# Patient Record
Sex: Male | Born: 1990 | Race: White | Hispanic: No | Marital: Single | Smoking: Never smoker
Health system: Southern US, Community
[De-identification: ages and names within clinical notes are randomized; demographics above are authoritative.]

---

## 2020-06-08 ENCOUNTER — Ambulatory Visit (INDEPENDENT_AMBULATORY_CARE_PROVIDER_SITE_OTHER): Payer: 59 | Admitting: Behavioral Health

## 2020-06-08 ENCOUNTER — Other Ambulatory Visit: Payer: Self-pay

## 2020-06-08 ENCOUNTER — Ambulatory Visit (INDEPENDENT_AMBULATORY_CARE_PROVIDER_SITE_OTHER): Payer: 59 | Admitting: Physician Assistant

## 2020-06-08 VITALS — BP 142/91 | HR 89 | Resp 100 | Ht 69.0 in | Wt 171.0 lb

## 2020-06-08 DIAGNOSIS — F909 Attention-deficit hyperactivity disorder, unspecified type: Secondary | ICD-10-CM | POA: Diagnosis not present

## 2020-06-08 DIAGNOSIS — F32A Depression, unspecified: Secondary | ICD-10-CM

## 2020-06-08 DIAGNOSIS — G479 Sleep disorder, unspecified: Secondary | ICD-10-CM

## 2020-06-08 NOTE — Progress Notes (Deleted)
Most reently with individual therapy - Tracy Robinson  Med:  Sertriline - 10mg  (1 week) Lamotrigin - 150mg  (1 week) 30mg  - Adderall (2-3 weeks)  [facility])  [facility]   My dad died from alcoholism and sister was in and out of ciincas for eating disorders  29yo  Adderall prescription 2 years ago - I was having a lot fo trouble focusing and low energy depression related to lack of interest
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
## 2020-06-13 MED ORDER — SERTRALINE HCL 100 MG PO TABS: 100.0000 mg | ORAL_TABLET | Freq: Every day | ORAL | 1 refills | Status: DC

## 2020-06-13 MED ORDER — AMPHETAMINE-DEXTROAMPHET ER 30 MG PO CP24: 30.0000 mg | ORAL_CAPSULE | Freq: Every day | ORAL | 0 refills | Status: DC

## 2020-06-13 MED ORDER — LAMOTRIGINE 100 MG PO TABS: 100.0000 mg | ORAL_TABLET | Freq: Every day | ORAL | 1 refills | Status: DC

## 2020-06-13 MED ORDER — TRAZODONE HCL 50 MG PO TABS: 50.0000 mg | ORAL_TABLET | Freq: Every day | ORAL | 1 refills | Status: DC

## 2020-07-11 ENCOUNTER — Encounter (HOSPITAL_COMMUNITY): Payer: Self-pay | Admitting: Physician Assistant

## 2020-07-11 ENCOUNTER — Ambulatory Visit (INDEPENDENT_AMBULATORY_CARE_PROVIDER_SITE_OTHER): Payer: 59 | Admitting: Physician Assistant

## 2020-07-11 ENCOUNTER — Other Ambulatory Visit: Payer: Self-pay

## 2020-07-11 VITALS — BP 129/81 | HR 96 | Ht 69.0 in | Wt 181.0 lb

## 2020-07-11 DIAGNOSIS — G479 Sleep disorder, unspecified: Secondary | ICD-10-CM | POA: Diagnosis not present

## 2020-07-11 DIAGNOSIS — F909 Attention-deficit hyperactivity disorder, unspecified type: Secondary | ICD-10-CM

## 2020-07-11 DIAGNOSIS — F32A Depression, unspecified: Secondary | ICD-10-CM

## 2020-07-11 MED ORDER — SERTRALINE HCL 100 MG PO TABS: 100.0000 mg | ORAL_TABLET | Freq: Every day | ORAL | 1 refills | Status: DC

## 2020-07-11 MED ORDER — LAMOTRIGINE 100 MG PO TABS
100.0000 mg | ORAL_TABLET | Freq: Every day | ORAL | 1 refills | Status: DC
Start: 2020-07-11 — End: 2020-09-11

## 2020-07-11 MED ORDER — AMPHETAMINE-DEXTROAMPHET ER 30 MG PO CP24: 30.0000 mg | ORAL_CAPSULE | Freq: Every day | ORAL | 0 refills | Status: DC

## 2020-07-11 MED ORDER — TRAZODONE HCL 100 MG PO TABS: 100.0000 mg | ORAL_TABLET | Freq: Every day | ORAL | 1 refills | Status: DC

## 2020-07-11 NOTE — Progress Notes (Signed)
BH MD/PA/NP OP Progress Note  07/11/2020 1:38 PM Tracy Robinson  MRN:  [id number]  Chief Complaint:  Chief Complaint    Medication Management     HPI:  Tracy Robinson is a 29 year old male with a past psychiatric history significant for depression, anxiety, ADHD, and sleep disturbances who [facility].  Patient is currently being managed on the following medications:  Lamotrigine 100 mg daily Sertraline 100 mg daily Adderall extended release 30 mg 24-hour capsule Trazodone 50 mg at bedtime  Patient reports no issues with his current medication regimen.  Patient would like to remain on 100 mg of lamotrigine daily as he felt a change in his interest/motivation and was able to get through the day.  Patient states that his Adderall XR 30 mg made him feel less irritable and states that it is much easier to remain compliant on the medication.  Patient is unable to tell whether or not his sleep has improved on 50 mg of trazodone at bedtime.  Patient does endorse that he been sleeping better and has not been been struggling to fall asleep.  Patient endorses that he still wakes up from time to time.  Patient is requesting refills on all his medications.  Patient reports that his mood has been better and that he experiences fewer days of inactivity.  Patient endorses an increase in feeling more interested in the things that he cares about.  Patient denies suicidal and homicidal ideations.  He further denies auditory or visual hallucinations.  He endorses fair sleep and receives on average 6-1/2 to 7 hours of intermittent sleep.  Patient endorses good appetite and states that he experiences stress eating and snacking throughout the day.  Patient endorses alcohol consumption (beer/wine per day).  Patient denies tobacco use.  Patient endorses marijuana use sparingly and states that he has smokes twice in the last 2 months.  Visit  Diagnosis:    ICD-10-CM   1. Attention deficit hyperactivity disorder (ADHD), unspecified ADHD type  F90.9 amphetamine-dextroamphetamine (ADDERALL XR) 30 MG 24 hr capsule    amphetamine-dextroamphetamine (ADDERALL XR) 30 MG 24 hr capsule  2. Sleep disturbances  G47.9 traZODone (DESYREL) 100 MG tablet  3. Depression, unspecified depression type  F32.A sertraline (ZOLOFT) 100 MG tablet    traZODone (DESYREL) 100 MG tablet    lamoTRIgine (LAMICTAL) 100 MG tablet    Past Psychiatric History: Depression Generalized anxiety disorder ADHD Sleep disturbances  Past Medical History: No past medical history on file.   Family Psychiatric History:  Father (deceased) - Alcoholism Sister - mood disorder. Patient states that his sister has also struggled with anorexia and bulimia  Family History: No family history on file.  Social History:  Social History   Socioeconomic History  . Marital status: Single    Spouse name: Not on file  . Number of children: Not on file  . Years of education: Not on file  . Highest education level: Not on file  Occupational History  . Not on file  Tobacco Use  . Smoking status: Never Smoker  . Smokeless tobacco: Never Used  Substance and Sexual Activity  . Alcohol use: Not on file  . Drug use: Not on file  . Sexual activity: Not on file  Other Topics Concern  . Not on file  Social History Narrative  . Not on file   Social Determinants of Health   Financial Resource Strain: Not on file  Food Insecurity: Not on  file  Transportation Needs: Not on file  Physical Activity: Not on file  Stress: Not on file  Social Connections: Not on file    Allergies: No Known Allergies  Metabolic Disorder Labs: No results found for: HGBA1C, MPG No results found for: PROLACTIN No results found for: CHOL, TRIG, HDL, CHOLHDL, VLDL, LDLCALC No results found for: TSH  Therapeutic Level Labs: No results found for: LITHIUM No results found for: VALPROATE No  components found for:  CBMZ  Current Medications: Current Outpatient Medications  Medication Sig Dispense Refill  . [START ON 07/18/2020] amphetamine-dextroamphetamine (ADDERALL XR) 30 MG 24 hr capsule Take 1 capsule (30 mg total) by mouth daily. 30 capsule 0  . [START ON 08/17/2020] amphetamine-dextroamphetamine (ADDERALL XR) 30 MG 24 hr capsule Take 1 capsule (30 mg total) by mouth daily. 30 capsule 0  . lamoTRIgine (LAMICTAL) 100 MG tablet Take 1 tablet (100 mg total) by mouth daily. 30 tablet 1  . sertraline (ZOLOFT) 100 MG tablet Take 1 tablet (100 mg total) by mouth daily. 30 tablet 1  . traZODone (DESYREL) 100 MG tablet Take 1 tablet (100 mg total) by mouth at bedtime. 30 tablet 1   No current facility-administered medications for this visit.     Musculoskeletal: Strength & Muscle Tone: within normal limits Gait & Station: normal Patient leans: N/A  Psychiatric Specialty Exam: Review of Systems  Psychiatric/Behavioral: Positive for sleep disturbance. Negative for decreased concentration, dysphoric mood, hallucinations, self-injury and suicidal ideas. The patient is not nervous/anxious and is not hyperactive.     Blood pressure 129/81, pulse 96, height 5\' 9"  (1.753 m), weight 181 lb (82.1 kg), SpO2 99 %.Body mass index is 26.73 kg/m.  General Appearance: Well Groomed  Eye Contact:  Good  Speech:  Clear and Coherent and Normal Rate  Volume:  Normal  Mood:  Euthymic  Affect:  Appropriate  Thought Process:  Coherent, Goal Directed and Descriptions of Associations: Intact  Orientation:  Full (Time, Place, and Person)  Thought Content: WDL and Logical   Suicidal Thoughts:  No  Homicidal Thoughts:  No  Memory:  Immediate;   Good Recent;   Good Remote;   Good  Judgement:  Good  Insight:  Good  Psychomotor Activity:  Normal  Concentration:  Concentration: Good and Attention Span: Good  Recall:  Good  Fund of Knowledge: Good  Language: Good  Akathisia:  NA  Handed:  Right   AIMS (if indicated): not done  Assets:  Communication Skills Desire for Improvement Housing Social Support Vocational/Educational  ADL's:  Intact  Cognition: WNL  Sleep:  Fair   Screenings:   Assessment and Plan:  Tracy Robinson is a 29 year old male with a past psychiatric history significant for depression, anxiety, ADHD, and sleep disturbances who presents to [facility] for medication management. Patient reports no issues with his current medication regimen.  Patient would like to remain on lamotrigine 100 mg daily due to noticing an increase in interests/motivations.  Patient reports that he still experiences waking up occasionally from his sleep at times.  Patient was recommended increasing trazodone dose from 50 mg to 100 mg at bedtime.  Patient was agreeable to recommendation.  Patient is requesting refills on all his medications.  Patient's medications will be e-prescribed to pharmacy of choice.  1. Attention deficit hyperactivity disorder (ADHD), unspecified ADHD type  - amphetamine-dextroamphetamine (ADDERALL XR) 30 MG 24 hr capsule; Take 1 capsule (30 mg total) by mouth daily.  Dispense: 30 capsule; Refill:  0 - amphetamine-dextroamphetamine (ADDERALL XR) 30 MG 24 hr capsule; Take 1 capsule (30 mg total) by mouth daily.  Dispense: 30 capsule; Refill: 0  2. Sleep disturbances  - traZODone (DESYREL) 100 MG tablet; Take 1 tablet (100 mg total) by mouth at bedtime.  Dispense: 30 tablet; Refill: 1  3. Depression, unspecified depression type  - sertraline (ZOLOFT) 100 MG tablet; Take 1 tablet (100 mg total) by mouth daily.  Dispense: 30 tablet; Refill: 1 - traZODone (DESYREL) 100 MG tablet; Take 1 tablet (100 mg total) by mouth at bedtime.  Dispense: 30 tablet; Refill: 1 - lamoTRIgine (LAMICTAL) 100 MG tablet; Take 1 tablet (100 mg total) by mouth daily.  Dispense: 30 tablet; Refill: 1  Patient to follow up in 6 weeks  Meta Hatchet,  PA 07/11/2020, 1:38 PM

## 2020-08-10 ENCOUNTER — Telehealth (HOSPITAL_COMMUNITY): Payer: Self-pay | Admitting: *Deleted

## 2020-08-10 NOTE — Telephone Encounter (Signed)
[facility] SENT FAXED REFILL REQUEST ON MED'S  amphetamine-dextroamphetamine (ADDERALL XR) 30 MG 24 hr capsule  amphetamine-dextroamphetamine (ADDERALL XR) 30 MG 24 hr capsule  lamoTRIgine (LAMICTAL) 100 MG tablet  sertraline (ZOLOFT) 100 MG tablet  traZODone (DESYREL) 100 MG tablet

## 2020-08-10 NOTE — Telephone Encounter (Signed)
Writer contacted by Tracy Robinson, RMA regarding medication refill request. Patient's medications were called into respective pharmacy.

## 2020-08-17 ENCOUNTER — Encounter (HOSPITAL_COMMUNITY): Payer: Self-pay | Admitting: Physician Assistant

## 2020-08-17 ENCOUNTER — Telehealth: Payer: Self-pay | Admitting: Family Medicine

## 2020-08-17 DIAGNOSIS — F909 Attention-deficit hyperactivity disorder, unspecified type: Secondary | ICD-10-CM | POA: Insufficient documentation

## 2020-08-17 DIAGNOSIS — F32A Depression, unspecified: Secondary | ICD-10-CM | POA: Insufficient documentation

## 2020-08-17 DIAGNOSIS — G479 Sleep disorder, unspecified: Secondary | ICD-10-CM | POA: Insufficient documentation
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
## 2020-08-21 ENCOUNTER — Encounter (HOSPITAL_COMMUNITY): Payer: Self-pay | Admitting: Physician Assistant

## 2020-08-22 ENCOUNTER — Encounter (HOSPITAL_COMMUNITY): Payer: 59 | Admitting: Physician Assistant

## 2020-09-11 ENCOUNTER — Other Ambulatory Visit (HOSPITAL_COMMUNITY): Payer: Self-pay | Admitting: Physician Assistant

## 2020-09-11 ENCOUNTER — Telehealth (HOSPITAL_COMMUNITY): Payer: Self-pay | Admitting: *Deleted

## 2020-09-11 DIAGNOSIS — F32A Depression, unspecified: Secondary | ICD-10-CM

## 2020-09-11 MED ORDER — LAMOTRIGINE 100 MG PO TABS: 100.0000 mg | ORAL_TABLET | Freq: Every day | ORAL | 1 refills | Status: DC

## 2020-09-11 NOTE — Telephone Encounter (Signed)
Provider was contacted by  Direce E McIntyre, RMA regarding refill request for patient. Patient's medication will be e-prescribed to pharmacy of choice. 

## 2020-09-11 NOTE — Progress Notes (Signed)
Provider was contacted by  Rushie Chestnut, RMA regarding refill request for patient. Patient's medication will be e-prescribed to pharmacy of choice.

## 2020-09-11 NOTE — Telephone Encounter (Signed)
Refill Request lamoTRIgine (LAMICTAL) 100 MG tablet 30 tablet 1 07/11/2020    Sig - Route: Take 1 tablet (100 mg total) by mouth daily

## 2020-09-22 ENCOUNTER — Encounter (HOSPITAL_COMMUNITY): Payer: Self-pay | Admitting: Physician Assistant

## 2020-09-22 ENCOUNTER — Other Ambulatory Visit: Payer: Self-pay

## 2020-09-22 ENCOUNTER — Ambulatory Visit (INDEPENDENT_AMBULATORY_CARE_PROVIDER_SITE_OTHER): Payer: 59 | Admitting: Physician Assistant

## 2020-09-22 VITALS — BP 124/74 | HR 77 | Ht 69.0 in | Wt 171.0 lb

## 2020-09-22 DIAGNOSIS — F909 Attention-deficit hyperactivity disorder, unspecified type: Secondary | ICD-10-CM | POA: Diagnosis not present

## 2020-09-22 DIAGNOSIS — G479 Sleep disorder, unspecified: Secondary | ICD-10-CM | POA: Diagnosis not present

## 2020-09-22 DIAGNOSIS — F32A Depression, unspecified: Secondary | ICD-10-CM

## 2020-09-22 MED ORDER — SERTRALINE HCL 100 MG PO TABS: 100.0000 mg | ORAL_TABLET | Freq: Every day | ORAL | 1 refills | Status: DC

## 2020-09-22 MED ORDER — AMPHETAMINE-DEXTROAMPHET ER 30 MG PO CP24: 30.0000 mg | ORAL_CAPSULE | Freq: Every day | ORAL | 0 refills | Status: DC

## 2020-09-22 MED ORDER — TRAZODONE HCL 100 MG PO TABS: 100.0000 mg | ORAL_TABLET | Freq: Every day | ORAL | 1 refills | Status: DC

## 2020-09-22 NOTE — Progress Notes (Signed)
BH MD/PA/NP OP Progress Note  09/22/2020 2:42 PM Tracy Robinson  MRN:  [id number]  Chief Complaint:  Chief Complaint    Medication Management     HPI:   Tracy Robinson is a 30 year old male with a past psychiatric history significant for major depressive disorder, generalized anxiety disorder, attention deficit hyperactivity disorder, and sleep disturbances who presents to [facility] for follow-up and medication management.  Patient is currently being managed on the following medications:  Sertraline 100 mg daily Adderall extended release 30 mg daily Trazodone 100 mg daily Lamictal 100 mg daily  Patient reports no issues or concerns with his current medication regimen.  Patient does endorse an issue he has been having since the previous encounter.  Patient states that his partner brought to his attention that he has been less connected and shown little interest in things.  Patient describes the behavior as not being present in the moment.  Patient reports that he feels this way roughly once a week and it lasts the greater portion of the day.  Patient attributes recent behavior to finishing a year-long/stressful project assignment.  Patient feels that his medications do not need to be readjusted for this behavior.  Patient would like to go down on his lamotrigine dosage because he does not feel that it is an essential contributor to his overall improvement in mood.  Patient is pleasant, calm, cooperative, and fully engaged in conversation during the encounter.  Patient reports improved mood and rates his mood a 6 out of 10.  Patient denies suicidal or homicidal ideations.  He further denies auditory or visual hallucinations.  Patient endorses good sleep and receives around 8 hours of sleep each night.  Patient endorses good appetite and eats on average 3 meals per day.  Patient endorses alcohol consumption and has roughly 5 drinks per week.  Patient denies tobacco  use.  Patient endorses illicit drug use in the form of marijuana at least once a week.  A GAD-7 screen was performed with the patient scoring a 20.  A PHQ 9 screen was also performed with the patient scoring a 23.  Visit Diagnosis:    ICD-10-CM   1. Depression, unspecified depression type  F32.A sertraline (ZOLOFT) 100 MG tablet    traZODone (DESYREL) 100 MG tablet  2. Attention deficit hyperactivity disorder (ADHD), unspecified ADHD type  F90.9 amphetamine-dextroamphetamine (ADDERALL XR) 30 MG 24 hr capsule  3. Sleep disturbances  G47.9 traZODone (DESYREL) 100 MG tablet    Past Psychiatric History: Depression Generalized anxiety disorder ADHD Sleep disturbances  Past Medical History: No past medical history on file. No past surgical history on file.  Family Psychiatric History:  Father (deceased) - Alcoholism Sister - mood disorder. Patient states that his sister has also struggled with anorexia and bulimia  Family History: No family history on file.  Social History:  Social History   Socioeconomic History  . Marital status: Single    Spouse name: Not on file  . Number of children: Not on file  . Years of education: Not on file  . Highest education level: Not on file  Occupational History  . Not on file  Tobacco Use  . Smoking status: Never Smoker  . Smokeless tobacco: Never Used  Substance and Sexual Activity  . Alcohol use: Not on file  . Drug use: Not on file  . Sexual activity: Not on file  Other Topics Concern  . Not on file  Social History Narrative  .  Not on file   Social Determinants of Health   Financial Resource Strain: Not on file  Food Insecurity: Not on file  Transportation Needs: Not on file  Physical Activity: Not on file  Stress: Not on file  Social Connections: Not on file    Allergies: No Known Allergies  Metabolic Disorder Labs: No results found for: HGBA1C, MPG No results found for: PROLACTIN No results found for: CHOL, TRIG, HDL,  CHOLHDL, VLDL, LDLCALC No results found for: TSH  Therapeutic Level Labs: No results found for: LITHIUM No results found for: VALPROATE No components found for:  CBMZ  Current Medications: Current Outpatient Medications  Medication Sig Dispense Refill  . lamoTRIgine (LAMICTAL) 100 MG tablet Take 1 tablet (100 mg total) by mouth daily. 30 tablet 1  . amphetamine-dextroamphetamine (ADDERALL XR) 30 MG 24 hr capsule Take 1 capsule (30 mg total) by mouth daily. 30 capsule 0  . sertraline (ZOLOFT) 100 MG tablet Take 1 tablet (100 mg total) by mouth daily. 30 tablet 1  . traZODone (DESYREL) 100 MG tablet Take 1 tablet (100 mg total) by mouth at bedtime. 30 tablet 1   No current facility-administered medications for this visit.     Musculoskeletal: Strength & Muscle Tone: within normal limits Gait & Station: normal Patient leans: N/A  Psychiatric Specialty Exam: Review of Systems  Psychiatric/Behavioral: Negative for decreased concentration, dysphoric mood, hallucinations, self-injury, sleep disturbance and suicidal ideas. The patient is nervous/anxious. The patient is not hyperactive.     Blood pressure 124/74, pulse 77, height 5\' 9"  (1.753 m), weight 171 lb (77.6 kg), SpO2 100 %.Body mass index is 25.25 kg/m.  General Appearance: Well Groomed  Eye Contact:  Good  Speech:  Clear and Coherent and Normal Rate  Volume:  Normal  Mood:  Anxious and Euthymic  Affect:  Appropriate and Congruent  Thought Process:  Coherent, Goal Directed and Descriptions of Associations: Intact  Orientation:  Full (Time, Place, and Person)  Thought Content: WDL and Logical   Suicidal Thoughts:  No  Homicidal Thoughts:  No  Memory:  Immediate;   Good Recent;   Good Remote;   Good  Judgement:  Good  Insight:  Good  Psychomotor Activity:  Normal  Concentration:  Concentration: Good and Attention Span: Good  Recall:  Good  Fund of Knowledge: Good  Language: Good  Akathisia:  NA  Handed:  Right   AIMS (if indicated): not done  Assets:  Communication Skills Desire for Improvement Housing Social Support Vocational/Educational  ADL's:  Intact  Cognition: WNL  Sleep:  Good   Screenings: GAD-7   Flowsheet Row Office Visit from 09/22/2020 in [facility]  Total GAD-7 Score 20    PHQ2-9   Flowsheet Row Office Visit from 09/22/2020 in [facility]  PHQ-2 Total Score 5  PHQ-9 Total Score 18    Flowsheet Row Office Visit from 09/22/2020 in [facility]  C-SSRS RISK CATEGORY No Risk       Assessment and Plan:   Jaimon Bugaj is a 31 year old male with a past psychiatric history significant for major depressive disorder, generalized anxiety disorder, attention deficit hyperactivity disorder, and sleep disturbances who presents to [facility] for follow-up and medication management.  Patient denies issues or concerns with his current medication regimen.  Patient would like to go down on his dosage of Lamictal because he feels that it is not contributing as much to the improvement of  his mood.  Patient was recommended to decrease dosage of Lamictal from 100 mg to 50 mg.  Patient was agreeable to recommendation.  Patient is requesting refills on his medications following the conclusion of the encounter.  Patient's medications will be e-prescribed to pharmacy of choice.  1. Depression, unspecified depression type Patient to start taking lamotrigine 50 mg daily.  - sertraline (ZOLOFT) 100 MG tablet; Take 1 tablet (100 mg total) by mouth daily.  Dispense: 30 tablet; Refill: 1 - traZODone (DESYREL) 100 MG tablet; Take 1 tablet (100 mg total) by mouth at bedtime.  Dispense: 30 tablet; Refill: 1  2. Attention deficit hyperactivity disorder (ADHD), unspecified ADHD type  - amphetamine-dextroamphetamine (ADDERALL XR) 30 MG 24 hr capsule; Take 1 capsule (30 mg total) by mouth  daily.  Dispense: 30 capsule; Refill: 0  3. Sleep disturbances  - traZODone (DESYREL) 100 MG tablet; Take 1 tablet (100 mg total) by mouth at bedtime.  Dispense: 30 tablet; Refill: 1  Patient to follow-up in 2 months  Meta Hatchet, PA 09/22/2020, 2:42 PM

## 2020-10-23 ENCOUNTER — Other Ambulatory Visit (HOSPITAL_COMMUNITY): Payer: Self-pay | Admitting: Physician Assistant

## 2020-10-23 ENCOUNTER — Telehealth (HOSPITAL_COMMUNITY): Payer: Self-pay | Admitting: *Deleted

## 2020-10-23 DIAGNOSIS — F909 Attention-deficit hyperactivity disorder, unspecified type: Secondary | ICD-10-CM

## 2020-10-23 MED ORDER — AMPHETAMINE-DEXTROAMPHET ER 30 MG PO CP24: 30.0000 mg | ORAL_CAPSULE | Freq: Every day | ORAL | 0 refills | Status: DC

## 2020-10-23 NOTE — Telephone Encounter (Signed)
VM left for writer stating he needed a new rx for his Adderall called into his pharmacy. Reviewed record and he should in fact be out. He also wanted to schedule an appt for 1;1 therapy.  Will reach out to his proivder for a new rx and have the front desk to call him back with a therapy appt.

## 2020-10-23 NOTE — Progress Notes (Signed)
Provider was contacted by Tracy Luna, RN regarding patient's medication refill. Patient's medication will be e-prescribed to pharmacy of choice.

## 2020-10-23 NOTE — Telephone Encounter (Signed)
Patient's medication e-prescribed to pharmacy of choice. 

## 2020-11-22 ENCOUNTER — Other Ambulatory Visit: Payer: Self-pay

## 2020-11-22 ENCOUNTER — Encounter (HOSPITAL_COMMUNITY): Payer: Self-pay | Admitting: Physician Assistant

## 2020-11-22 ENCOUNTER — Ambulatory Visit (INDEPENDENT_AMBULATORY_CARE_PROVIDER_SITE_OTHER): Payer: 59 | Admitting: Physician Assistant

## 2020-11-22 ENCOUNTER — Ambulatory Visit (INDEPENDENT_AMBULATORY_CARE_PROVIDER_SITE_OTHER): Payer: 59 | Admitting: Psychiatry

## 2020-11-22 VITALS — BP 119/75 | HR 79 | Ht 69.0 in | Wt 159.0 lb

## 2020-11-22 DIAGNOSIS — F331 Major depressive disorder, recurrent, moderate: Secondary | ICD-10-CM

## 2020-11-22 DIAGNOSIS — F411 Generalized anxiety disorder: Secondary | ICD-10-CM | POA: Insufficient documentation

## 2020-11-22 DIAGNOSIS — F32A Depression, unspecified: Secondary | ICD-10-CM

## 2020-11-22 DIAGNOSIS — Z87898 Personal history of other specified conditions: Secondary | ICD-10-CM

## 2020-11-22 DIAGNOSIS — F1291 Cannabis use, unspecified, in remission: Secondary | ICD-10-CM

## 2020-11-22 DIAGNOSIS — G479 Sleep disorder, unspecified: Secondary | ICD-10-CM | POA: Diagnosis not present

## 2020-11-22 DIAGNOSIS — F909 Attention-deficit hyperactivity disorder, unspecified type: Secondary | ICD-10-CM

## 2020-11-22 MED ORDER — AMPHETAMINE-DEXTROAMPHET ER 30 MG PO CP24: 30.0000 mg | ORAL_CAPSULE | Freq: Every day | ORAL | 0 refills | Status: DC

## 2020-11-22 MED ORDER — SERTRALINE HCL 50 MG PO TABS
150.0000 mg | ORAL_TABLET | Freq: Every day | ORAL | 2 refills | Status: DC
Start: 2020-11-22 — End: 2021-01-23

## 2020-11-22 MED ORDER — TRAZODONE HCL 100 MG PO TABS: 100.0000 mg | ORAL_TABLET | Freq: Every day | ORAL | 1 refills | Status: DC
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
## 2020-11-22 NOTE — Progress Notes (Signed)
BH MD/PA/NP OP Progress Note  11/24/2020 9:29 PM Tracy Robinson  MRN:  [id number]  Chief Complaint:  Chief Complaint    Medication Management     HPI:   Tracy Robinson is a 30 year old male with a past psychiatric history significant for major depressive disorder, generalized anxiety disorder, attention deficit hyperactivity disorder, and sleep disturbances who presents to [facility] for follow-up and medication management.  Patient is currently being managed on the following medications:  Sertraline 100 mg daily Adderall XR 30 mg daily Trazodone 100 mg at bedtime Lamictal 50 mg daily  During the encounter, patient informed the provider that he had run out of Lamictal 50 mg for 2 weeks and decided to stop taking it altogether.  Patient states that he experienced some mood dysregulation after discontinuing lamictal but after 4 to 5 days, patient experienced no other side effects.  Patient reports that his current medication regimen has been going pretty well.  He expresses that he has not felt as bad and that his eating and sleeping has improved.  Patient reports some chest anxiety here and there and occasionally experiences nausea which he attributes from taking sertraline.    Patient expresses the complaint that his Adderall is not lasting long enough and feels that it tapers off around 7:30 PM.  Patient is interested in taking a different formulation of Adderall that may last longer than the current prescription he is taking.  Patient was informed that there was no other formulation the last longer than the current prescription of Adderall he is taking. Patient is agreeable to continuing his current prescription of Adderall XR 30 mg daily. Patient reports that he will be seeing a new therapist today and inquired about ways to effectively communicate with his therapist regarding his past history.  A PHQ-9 was performed with the patient scoring a 19.  A GAD-7  screen was also performed with the patient scoring a 19.  Patient is pleasant, calm, cooperative, and fully engaged in conversation during the encounter.  Patient reports that his current mood is frantic but attributes this feeling due to being late for his appointment today.  Patient denies any current negative feelings but states that he is behind on some of his work which often makes him feel inadequate and triggers episodes of self loathing.  Patient denies suicidal or homicidal ideations.  He further denies auditory or visual hallucinations.  Patient endorses good sleep and receives on average 8 hours of sleep each night.  Patient endorses good appetite and eats on average 2 meals a day with snacking in between.  Patient endorses alcohol consumption and states that he had 4 drinks this past week.  Patient denies tobacco use.  Patient endorses illicit drug use in the form of marijuana and states that he smokes 1-2 times per day.  Visit Diagnosis:    ICD-10-CM   1. Attention deficit hyperactivity disorder (ADHD), unspecified ADHD type  F90.9 amphetamine-dextroamphetamine (ADDERALL XR) 30 MG 24 hr capsule  2. Depression, unspecified depression type  F32.A traZODone (DESYREL) 100 MG tablet    sertraline (ZOLOFT) 50 MG tablet  3. Sleep disturbances  G47.9 traZODone (DESYREL) 100 MG tablet  4. Generalized anxiety disorder  F41.1 sertraline (ZOLOFT) 50 MG tablet    Past Psychiatric History:  Depression Generalized anxiety disorder ADHD Sleep disturbances  Past Medical History: History reviewed. No pertinent past medical history. History reviewed. No pertinent surgical history.  Family Psychiatric History:  Father (deceased) - Alcoholism Sister -  mood disorder. Patient states that his sister has also struggled with anorexia and bulimia  Family History: History reviewed. No pertinent family history.  Social History:  Social History   Socioeconomic History  . Marital status: Single     Spouse name: Not on file  . Number of children: Not on file  . Years of education: Not on file  . Highest education level: Not on file  Occupational History  . Not on file  Tobacco Use  . Smoking status: Never Smoker  . Smokeless tobacco: Never Used  Substance and Sexual Activity  . Alcohol use: Not on file  . Drug use: Not on file  . Sexual activity: Not on file  Other Topics Concern  . Not on file  Social History Narrative  . Not on file   Social Determinants of Health   Financial Resource Strain: Not on file  Food Insecurity: Not on file  Transportation Needs: Not on file  Physical Activity: Not on file  Stress: Not on file  Social Connections: Not on file    Allergies: No Known Allergies  Metabolic Disorder Labs: No results found for: HGBA1C, MPG No results found for: PROLACTIN No results found for: CHOL, TRIG, HDL, CHOLHDL, VLDL, LDLCALC No results found for: TSH  Therapeutic Level Labs: No results found for: LITHIUM No results found for: VALPROATE No components found for:  CBMZ  Current Medications: Current Outpatient Medications  Medication Sig Dispense Refill  . sertraline (ZOLOFT) 50 MG tablet Take 3 tablets (150 mg total) by mouth daily. 90 tablet 2  . amphetamine-dextroamphetamine (ADDERALL XR) 30 MG 24 hr capsule Take 1 capsule (30 mg total) by mouth daily. 30 capsule 0  . traZODone (DESYREL) 100 MG tablet Take 1 tablet (100 mg total) by mouth at bedtime. 30 tablet 1   No current facility-administered medications for this visit.     Musculoskeletal: Strength & Muscle Tone: within normal limits Gait & Station: normal Patient leans: N/A  Psychiatric Specialty Exam: Review of Systems  Psychiatric/Behavioral: Positive for decreased concentration. Negative for dysphoric mood, hallucinations, self-injury, sleep disturbance and suicidal ideas. The patient is nervous/anxious. The patient is not hyperactive.     Blood pressure 119/75, pulse 79, height  5\' 9"  (1.753 m), weight 159 lb (72.1 kg).Body mass index is 23.48 kg/m.  General Appearance: Well Groomed  Eye Contact:  Good  Speech:  Clear and Coherent and Normal Rate  Volume:  Normal  Mood:  Anxious and Depressed  Affect:  Congruent and Depressed  Thought Process:  Coherent, Goal Directed and Descriptions of Associations: Intact  Orientation:  Full (Time, Place, and Person)  Thought Content: WDL and Logical   Suicidal Thoughts:  No  Homicidal Thoughts:  No  Memory:  Immediate;   Good Recent;   Good Remote;   Good  Judgement:  Good  Insight:  Good  Psychomotor Activity:  Normal  Concentration:  Concentration: Good and Attention Span: Good  Recall:  Good  Fund of Knowledge: Good  Language: Good  Akathisia:  NA  Handed:  Right  AIMS (if indicated): not done  Assets:  Communication Skills Desire for Improvement Housing Social Support Vocational/Educational  ADL's:  Intact  Cognition: WNL  Sleep:  Good   Screenings: GAD-7   Flowsheet Row Office Visit from 11/22/2020 in [facility] Office Visit from 09/22/2020 in [facility]  Total GAD-7 Score 19 20    PHQ2-9   Flowsheet Row Office Visit from 11/22/2020  in [facility] Office Visit from 09/22/2020 in [facility]  PHQ-2 Total Score 5 5  PHQ-9 Total Score 19 18    Flowsheet Row Office Visit from 11/22/2020 in [facility] Office Visit from 09/22/2020 in [facility]  C-SSRS RISK CATEGORY No Risk No Risk       Assessment and Plan:   Illya Gienger is a 30 year old male with a past psychiatric history significant for major depressive disorder, generalized anxiety disorder, attention deficit hyperactivity disorder, and sleep disturbances who presents to [facility] clinic for follow-up and medication management.  Patient reports that  he discontinued taking Lamictal 50 mg after running out of his medication.  Patient denies any lasting side effects or mood dysregulation after discontinuing his Lamictal.  Patient denies the need for dosage adjustments with his current regimen of medications.  Patient is requesting refills following the conclusion of the encounter.  Patient's medications to be e-prescribed to pharmacy of choice.  1. Attention deficit hyperactivity disorder (ADHD), unspecified ADHD type  - amphetamine-dextroamphetamine (ADDERALL XR) 30 MG 24 hr capsule; Take 1 capsule (30 mg total) by mouth daily.  Dispense: 30 capsule; Refill: 0  2. Sleep disturbances  - traZODone (DESYREL) 100 MG tablet; Take 1 tablet (100 mg total) by mouth at bedtime.  Dispense: 30 tablet; Refill: 1  3. Generalized anxiety disorder  - sertraline (ZOLOFT) 50 MG tablet; Take 3 tablets (150 mg total) by mouth daily.  Dispense: 90 tablet; Refill: 2  4. Moderate episode of recurrent major depressive disorder (HCC)  - sertraline (ZOLOFT) 50 MG tablet; Take 3 tablets (150 mg total) by mouth daily.  Dispense: 90 tablet; Refill: 2  Patient to follow up in 2 months  Meta Hatchet, PA 11/24/2020, 9:29 PM

## 2020-11-24 DIAGNOSIS — F331 Major depressive disorder, recurrent, moderate: Secondary | ICD-10-CM | POA: Insufficient documentation

## 2020-12-12 ENCOUNTER — Other Ambulatory Visit: Payer: Self-pay

## 2020-12-12 ENCOUNTER — Ambulatory Visit (INDEPENDENT_AMBULATORY_CARE_PROVIDER_SITE_OTHER): Payer: 59 | Admitting: Psychiatry

## 2020-12-12 DIAGNOSIS — F331 Major depressive disorder, recurrent, moderate: Secondary | ICD-10-CM | POA: Diagnosis not present

## 2020-12-12 DIAGNOSIS — F411 Generalized anxiety disorder: Secondary | ICD-10-CM | POA: Diagnosis not present

## 2020-12-12 DIAGNOSIS — F909 Attention-deficit hyperactivity disorder, unspecified type: Secondary | ICD-10-CM | POA: Diagnosis not present

## 2020-12-12 DIAGNOSIS — Z638 Other specified problems related to primary support group: Secondary | ICD-10-CM

## 2020-12-12 DIAGNOSIS — Z630 Problems in relationship with spouse or partner: Secondary | ICD-10-CM | POA: Diagnosis not present

## 2020-12-12 NOTE — Progress Notes (Signed)
Psychotherapy Progress Note [facility] Marliss Czar, PhD LP  Patient ID: Tracy Robinson     MRN: [id number] Therapy format: Individual psychotherapy Date: 12/12/2020      Start: 9:05a     Stop: 9:55a     Time Spent: 50 min Location: In-person   Session narrative (presenting needs, interim history, self-report of stressors and symptoms, applications of prior therapy, status changes, and interventions made in session) "I sort of failed at writing things down", thought about it but didn't write.  Did the "I don't want to tantrum" as noted, learned the chapter really was not necessary, gave self permission not to work it.  Then got interested, researched a bit.  Lost some momentum, then went on family beach trip with mother (turned 70, not vaccinated).  Went [name] and sister for latter half, noted mother bad with boundaries with her sadsacking, alcoholic boyfriend who made inappropriate comments to Tracy Robinson (not revealed to Tracy Robinson) and got into fights [name] while there, pushing Tracy Robinson to leave early.  BF got manipulative, feigned suicidality, angered Tracy Robinson, but also ashamed of how he was feeling, having grown up in [place], then run away from family after graduation to [place], [place], wound up in Tracy [place], then pandemic made it possible   Coached in writing to advisor to come clean about being stuck/behind, working with therapist now, apology for leaving him in the dark.    Confesses he signed up for a bronze plan (ACA) and there is more trouble in his relationship [name] than revealed.  Deal made several year ago to scrupulously split costs 50-50, then she was depressed 6 years not working, apparently insisted on continuing the accounting, which meant she accrued $30K debt to him, which he is uncomfortable with.  Discussed his current need for financial backing in light of this, comparing the idea of no debt to each other -- as befits being married-in-principle -- with the idea  of being independent actors and one way to relieve her debt would be to help him financially right now, but he asserts it's more complicated than that since she feels he has been less than caring toward her during her depression and would have been more generous of time and attention.  Acknowledged there seems to be a much more nuanced relationship, can address it more at another time if desired, and the other options of temporary debt, taking on part-time job, or seeking help with others besides Tracy Robinson remain legitimate choices.    Meanwhile, confirmed action plans for today, believes he can accomplish both informing his advisor and investigating options with insurance.  Affirmed that writing down accomplishments like these is optional, too, not a requirement, just an encouragement available to him.  Briefly addressed the temptation to take on "50" other things he needs to do when he does focus on a task, modelling talking to internal distractions as if they are a crowd of people in his personal waiting room, making the point that all of them are being served in their turn, and it's important for "them" to be patient [name] so that he can move along and get to them in their due time, but rest assured, he is serving them, too.  Therapeutic modalities: Cognitive Behavioral Therapy and Solution-Oriented/Positive Psychology  Mental Status/Observations:  Appearance:   Casual     Behavior:  Appropriate  Motor:  Normal  Speech/Language:   Clear and Coherent  Affect:  Appropriate  Mood:  anxious and dysthymic  Thought process:  normal  Thought content:    WNL and self-recrimination  Sensory/Perceptual disturbances:    WNL  Orientation:  Fully oriented  Attention:  Good    Concentration:  Good  Memory:  WNL  Insight:    Good  Judgment:   Good  Impulse Control:  Good   Risk Assessment: Danger to Self: No Self-injurious Behavior: No Danger to Others: No Physical Aggression / Violence: No Duty to  Warn: No Access to Firearms a concern: No  Assessment of progress:  progressing  Diagnosis:   ICD-10-CM   1. Moderate episode of recurrent major depressive disorder (HCC)  F33.1   2. Generalized anxiety disorder  F41.1   3. Attention deficit hyperactivity disorder (ADHD), unspecified ADHD type  F90.9   4. Relationship problem between partners  Z63.0   5. Relationship problem with family members  Z63.8    Plan:  . Draft email to advisor this morning, using elements established in session . Call insurance to find out about possibility of adjusting his plan . May speak with office manager about plan for incurring limited debt and making paced payments.  Est $500 max debt. . Consider part-time employment for [employer] and stimulation/purposes outside the home . Use internal self-talk example for managing shame-driven tendency to overload the opportunity to action . Other recommendations/advice as may be noted above . Continue to utilize previously learned skills ad lib . Maintain medication as prescribed and work faithfully with relevant prescriber(s) if any changes are desired or seem indicated . Call the clinic on-call service, present to ER, or call 911 if any life-threatening psychiatric crisis Return in about 2 weeks (around 12/26/2020) for put on cancellation list if desired. . Already scheduled visit in this office 01/10/2021.  [name], PhD Marliss Czar, PhD LP Clinical Psychologist, [facility] [address] [place], [place] [address] [phone]

## 2020-12-27 ENCOUNTER — Ambulatory Visit: Payer: Self-pay | Admitting: Nurse Practitioner

## 2020-12-27 ENCOUNTER — Ambulatory Visit: Payer: 59 | Admitting: Psychiatry

## 2021-01-04 ENCOUNTER — Telehealth (HOSPITAL_COMMUNITY): Payer: Self-pay | Admitting: *Deleted

## 2021-01-04 NOTE — Telephone Encounter (Signed)
VM left on writers phone from patient requesting a new rx for her Adderall. Reviewed record and his last rx was written on 11/22/20 for 30 days. His next appt with his provider is on 01/23/21 at 1 pm. Will make request known to [place] PA for him to make a new rx available to patient.

## 2021-01-05 ENCOUNTER — Other Ambulatory Visit (HOSPITAL_COMMUNITY): Payer: Self-pay | Admitting: Physician Assistant

## 2021-01-05 DIAGNOSIS — F909 Attention-deficit hyperactivity disorder, unspecified type: Secondary | ICD-10-CM

## 2021-01-05 MED ORDER — AMPHETAMINE-DEXTROAMPHET ER 30 MG PO CP24: 30.0000 mg | ORAL_CAPSULE | Freq: Every day | ORAL | 0 refills | Status: DC

## 2021-01-05 NOTE — Progress Notes (Signed)
Provider was contacted by Wynona Luna, RN regarding patient's Adderall prescription. Patient's Adderall to be e-prescribed to pharmacy of choice.

## 2021-01-05 NOTE — Telephone Encounter (Signed)
Provider was contacted by Suzanne K Beck, RN regarding patient's Adderall prescription. Patient's Adderall to be e-prescribed to pharmacy of choice. 

## 2021-01-10 ENCOUNTER — Ambulatory Visit: Payer: 59 | Admitting: Psychiatry

## 2021-01-10 ENCOUNTER — Other Ambulatory Visit: Payer: Self-pay

## 2021-01-10 DIAGNOSIS — Z630 Problems in relationship with spouse or partner: Secondary | ICD-10-CM

## 2021-01-10 DIAGNOSIS — M549 Dorsalgia, unspecified: Secondary | ICD-10-CM

## 2021-01-10 DIAGNOSIS — F331 Major depressive disorder, recurrent, moderate: Secondary | ICD-10-CM

## 2021-01-10 DIAGNOSIS — F1910 Other psychoactive substance abuse, uncomplicated: Secondary | ICD-10-CM

## 2021-01-10 DIAGNOSIS — F909 Attention-deficit hyperactivity disorder, unspecified type: Secondary | ICD-10-CM | POA: Diagnosis not present

## 2021-01-10 DIAGNOSIS — F411 Generalized anxiety disorder: Secondary | ICD-10-CM | POA: Diagnosis not present

## 2021-01-10 NOTE — Progress Notes (Signed)
Psychotherapy Progress Note [facility] Tracy Czar, PhD LP  Patient ID: Tracy Robinson     MRN: [id number] Therapy format: Individual psychotherapy Date: 01/10/2021      Start: 10:12a     Stop: 11:02a     Time Spent: 50 min Location: In-person   Session narrative (presenting needs, interim history, self-report of stressors and symptoms, applications of prior therapy, status changes, and interventions made in session) Fingernails painted various colors.  Tracy Robinson broke up with him after 12 years, and he is suddenly living with mother, this weekend.  Misses the two cats badly (littermates, who will stay together and [name]).  M's house is physically stable and financially beneficial, and M is being respectful and helpful.  Amicable dealings [name], will meet to work out terms of financial separation.  Knows he has had a problem with desperation in the past.  Day-to-day anxiety is down some, despite the upheaval.    Discussed -- mostly while standing, dealing with anxiety and ambient noise sensitivity -- tactics for refocusing on dissertation work and making the adjustment.  Did contact advisor to update her on his personal situation and plan next phases of academic work.  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  Did contact insurance, found out it was only an administrative error that he was considered [name].  No focal concerns at this time about access to care and affordability.  Confesses alcohol and marijuana abuse for a long time.  Has used to substantial intoxication before, wants to be free of that but retain marijuana for temperate recreational use.  Currently supply is low, and available alcohol in mother's house is modest.  Reframed as drive to self-medicate anxiety, stress, or depressed mood, encouraged in use of things he already can do behaviorally (music, nap, movement) and use of  diaphragmatic breathing technique.  Tuned up diaphragmatic breathing with throat-oriented exhale control ("H" sound).  Therapeutic modalities: Cognitive Behavioral Therapy, Solution-Oriented/Positive Psychology, and Ego-Supportive  Mental Status/Observations:  Appearance:   Casual     Behavior:  Appropriate  Motor:  Normal  Speech/Language:   Clear and Coherent  Affect:  Appropriate  Mood:  anxious  Thought process:  normal  Thought content:    WNL  Sensory/Perceptual disturbances:    WNL  Orientation:  Fully oriented  Attention:  Good    Concentration:  Fair  Memory:  WNL  Insight:    Good  Judgment:   Good  Impulse Control:  Fair   Risk Assessment: Danger to Self: No Self-injurious Behavior: No Danger to Others: No Physical Aggression / Violence: No Duty to Warn: No Access to Firearms a concern: No  Assessment of progress:  progressing  Diagnosis:   ICD-10-CM   1. Generalized anxiety disorder  F41.1     2. Moderate episode of recurrent major depressive disorder (HCC)  F33.1     3. Relationship problem between partners  Z63.0     4. Attention deficit hyperactivity disorder (ADHD), unspecified ADHD type  F90.9    r/o sensory hypersensitivity underlying attention issues     5. Back pain, unspecified back location, unspecified back pain laterality, unspecified chronicity  M54.9     6. Polysubstance abuse (HCC) -- alcohol, marijuana  F19.10      Plan:  Priority today to make a work niche at the house, find noise-canceling headphones, and assess revisions he wants to make to his  thesis introduction Will meet [name] soon with plans to accept a buyout of his interest in the shared car and declare other debts void or forgiven, for the sake of peace, respect, and narrowing the focus of issue for both their sakes.  Self-affirm constructive action. Reframe urges to drink or use pot as urges to bring down anxiety, and try nonchemical self-soothing tactics first  Other  recommendations/advice as may be noted above Continue to utilize previously learned skills ad lib Maintain medication as prescribed and work faithfully with relevant prescriber(s) if any changes are desired or seem indicated Call the clinic on-call service, present to ER, or call 911 if any life-threatening psychiatric crisis Return in about 1 week (around 01/17/2021) for put on cancellation list. Already scheduled visit in this office 01/23/2021.  Tracy Fries, PhD Tracy Czar, PhD LP Clinical Psychologist, [facility] [address] [place], [place] [address] [phone]

## 2021-01-23 ENCOUNTER — Encounter (HOSPITAL_COMMUNITY): Payer: Self-pay | Admitting: Physician Assistant

## 2021-01-23 ENCOUNTER — Ambulatory Visit (INDEPENDENT_AMBULATORY_CARE_PROVIDER_SITE_OTHER): Payer: 59 | Admitting: Psychiatry

## 2021-01-23 ENCOUNTER — Other Ambulatory Visit: Payer: Self-pay

## 2021-01-23 ENCOUNTER — Ambulatory Visit (INDEPENDENT_AMBULATORY_CARE_PROVIDER_SITE_OTHER): Payer: 59 | Admitting: Physician Assistant

## 2021-01-23 DIAGNOSIS — Z630 Problems in relationship with spouse or partner: Secondary | ICD-10-CM | POA: Diagnosis not present

## 2021-01-23 DIAGNOSIS — F1910 Other psychoactive substance abuse, uncomplicated: Secondary | ICD-10-CM

## 2021-01-23 DIAGNOSIS — F331 Major depressive disorder, recurrent, moderate: Secondary | ICD-10-CM | POA: Diagnosis not present

## 2021-01-23 DIAGNOSIS — F411 Generalized anxiety disorder: Secondary | ICD-10-CM

## 2021-01-23 DIAGNOSIS — G479 Sleep disorder, unspecified: Secondary | ICD-10-CM | POA: Diagnosis not present

## 2021-01-23 DIAGNOSIS — F909 Attention-deficit hyperactivity disorder, unspecified type: Secondary | ICD-10-CM | POA: Diagnosis not present

## 2021-01-23 MED ORDER — TRAZODONE HCL 100 MG PO TABS: 100.0000 mg | ORAL_TABLET | Freq: Every day | ORAL | 1 refills | Status: DC

## 2021-01-23 MED ORDER — SERTRALINE HCL 50 MG PO TABS
150.0000 mg | ORAL_TABLET | Freq: Every day | ORAL | 2 refills | Status: DC
Start: 2021-01-23 — End: 2021-03-20

## 2021-01-23 NOTE — Progress Notes (Signed)
BH MD/PA/NP OP Progress Note  01/23/2021 9:53 PM Tracy Robinson  MRN:  [id number]  Chief Complaint: Follow up and medication management  HPI:   Tracy Robinson is a 30 year old male with a past psychiatric history significant for sleep disturbances, generalized anxiety disorder, and major depressive disorder who presents to [facility] for follow-up and medication management.  Patient is currently being managed on the following medications:  Sertraline 150 mg daily Trazodone 100 mg at bedtime Adderall XR 30 mg daily  Patient reports no issues or concerns regarding his current medication regimen.  Patient denies the need for dosage adjustments at this time and is requesting refills on all of his medications following the conclusion of the encounter.  Patient expresses that although his medications appear fine he would like to give his sertraline some more time before adjusting the dosage to see if he experiences an improvement in his mood.  Patient expresses that he is still pretty depressed and miserable but states that his presentation is not any worse than it usually is.  Patient endorses the following depressive symptoms: depressed mood lack of motivation, and decreased energy.  Patient attributes some of his depression to the recent break-up from his partner of 12 years.  Patient states that he and his ex are still amicable, however, the break-up has been hard on him.  Patient states that he has recently moved back in with his mother.  Patient endorses worsening anxiety which he also attributes to his recent break-up.  Patient endorses the following symptoms related to his anxiety: stomach pains, gassiness, and GI upset/tightness.  A PHQ-9 screen was performed with the patient scoring a 20.  A GAD-7 screen was also performed with the patient scoring an 18.  Patient is calm, cooperative, and fully engaged in conversation during the encounter.  Patient states that  although he is pretty down and feels pretty miserable, he is still optimistic.  Patient denies suicidal or homicidal ideations.  He further denies auditory or visual hallucinations and does not appear to be responding to internal/external stimuli.  Patient endorses good sleep and receives on average 7 hours of sleep each night.  Patient endorses good appetite and eats on average 3 meals per day.  Patient endorses alcohol consumption stating that he has 1 or 2 drinks a day.  Patient reports that his drinking habits is relatively new since living with his mother.  Patient denies tobacco use.  Patient endorses illicit drug use in the form of marijuana and smokes roughly 1-2 times per day.  Patient states that he did not smoke any marijuana last week.  Visit Diagnosis:    ICD-10-CM   1. Sleep disturbances  G47.9 traZODone (DESYREL) 100 MG tablet    2. Generalized anxiety disorder  F41.1 sertraline (ZOLOFT) 50 MG tablet    3. Moderate episode of recurrent major depressive disorder (HCC)  F33.1 sertraline (ZOLOFT) 50 MG tablet      Past Psychiatric History:  Depression Generalized anxiety disorder ADHD Sleep disturbances  Past Medical History: History reviewed. No pertinent past medical history. History reviewed. No pertinent surgical history.  Family Psychiatric History:  Father (deceased) - Alcoholism Sister - mood disorder. Patient states that his sister has also struggled with anorexia and bulimia  Family History: History reviewed. No pertinent family history.  Social History:  Social History   Socioeconomic History   Marital status: Single    Spouse name: Not on file   Number of children: Not on file  Years of education: Not on file   Highest education level: Not on file  Occupational History   Not on file  Tobacco Use   Smoking status: Never   Smokeless tobacco: Never  Substance and Sexual Activity   Alcohol use: Not on file   Drug use: Not on file   Sexual activity: Not  on file  Other Topics Concern   Not on file  Social History Narrative   Not on file   Social Determinants of Health   Financial Resource Strain: Not on file  Food Insecurity: Not on file  Transportation Needs: Not on file  Physical Activity: Not on file  Stress: Not on file  Social Connections: Not on file    Allergies: No Known Allergies  Metabolic Disorder Labs: No results found for: HGBA1C, MPG No results found for: PROLACTIN No results found for: CHOL, TRIG, HDL, CHOLHDL, VLDL, LDLCALC No results found for: TSH  Therapeutic Level Labs: No results found for: LITHIUM No results found for: VALPROATE No components found for:  CBMZ  Current Medications: Current Outpatient Medications  Medication Sig Dispense Refill   amphetamine-dextroamphetamine (ADDERALL XR) 30 MG 24 hr capsule Take 1 capsule (30 mg total) by mouth daily. 30 capsule 0   sertraline (ZOLOFT) 50 MG tablet Take 3 tablets (150 mg total) by mouth daily. 90 tablet 2   traZODone (DESYREL) 100 MG tablet Take 1 tablet (100 mg total) by mouth at bedtime. 30 tablet 1   No current facility-administered medications for this visit.     Musculoskeletal: Strength & Muscle Tone: within normal limits Gait & Station: normal Patient leans: N/A  Psychiatric Specialty Exam: Review of Systems  Psychiatric/Behavioral:  Positive for sleep disturbance. Negative for decreased concentration, dysphoric mood, hallucinations, self-injury and suicidal ideas. The patient is nervous/anxious. The patient is not hyperactive.    Blood pressure 125/81, pulse 73, height 5\' 8"  (1.727 m), weight 153 lb (69.4 kg).Body mass index is 23.26 kg/m.  General Appearance: Well Groomed  Eye Contact:  Good  Speech:  Clear and Coherent and Normal Rate  Volume:  Normal  Mood:  Anxious and Depressed  Affect:  Congruent and Depressed  Thought Process:  Coherent and Descriptions of Associations: Intact  Orientation:  Full (Time, Place, and Person)   Thought Content: WDL and Logical   Suicidal Thoughts:  No  Homicidal Thoughts:  No  Memory:  Immediate;   Good Recent;   Good Remote;   Good  Judgement:  Good  Insight:  Good  Psychomotor Activity:  Normal  Concentration:  Concentration: Good and Attention Span: Good  Recall:  Good  Fund of Knowledge: Good  Language: Good  Akathisia:  NA  Handed:  Right  AIMS (if indicated): not done  Assets:  Communication Skills Desire for Improvement Housing Social Support Vocational/Educational  ADL's:  Intact  Cognition: WNL  Sleep:  Fair   Screenings: GAD-7    Flowsheet Row Clinical Support from 01/23/2021 in [facility] Office Visit from 11/22/2020 in [facility] Office Visit from 09/22/2020 in [facility]  Total GAD-7 Score 18 19 20       PHQ2-9    Flowsheet Row Clinical Support from 01/23/2021 in [facility] Office Visit from 11/22/2020 in [facility] Office Visit from 09/22/2020 in [facility]  PHQ-2 Total Score 6 5 5   PHQ-9 Total Score 20 19 18  Flowsheet Row Clinical Support from 01/23/2021 in [facility] Office Visit from 11/22/2020 in [facility] Office Visit from 09/22/2020 in [facility]  C-SSRS RISK CATEGORY No Risk No Risk No Risk        Assessment and Plan:   Allenmichael Mcpartlin is a 30 year old male with a past psychiatric history significant for sleep disturbances, generalized anxiety disorder, and major depressive disorder who presents to [facility] for follow-up and medication management.  Patient endorses depression and anxiety which he attributes to a recent break-up from his partner of 12 years.  Patient expresses that he does not want his medications to be adjusted due to his  current symptoms being somewhat related to his recent break up.  Patient is requesting refills of his medications following the conclusion of the encounter.  Patient's medications to be e-prescribed to pharmacy of choice.  Patient was educated on limiting his alcohol consumption and marijuana use.  Patient was receptive to instruction.  1. Sleep disturbances  - traZODone (DESYREL) 100 MG tablet; Take 1 tablet (100 mg total) by mouth at bedtime.  Dispense: 30 tablet; Refill: 1  2. Generalized anxiety disorder  - sertraline (ZOLOFT) 50 MG tablet; Take 3 tablets (150 mg total) by mouth daily.  Dispense: 90 tablet; Refill: 2  3. Moderate episode of recurrent major depressive disorder (HCC)  - sertraline (ZOLOFT) 50 MG tablet; Take 3 tablets (150 mg total) by mouth daily.  Dispense: 90 tablet; Refill: 2  4. Attention deficit hyperactivity disorder (ADHD), unspecified ADHD type  - amphetamine-dextroamphetamine (ADDERALL XR) 30 MG 24 hr capsule; Take 1 capsule (30 mg total) by mouth daily.  Dispense: 30 capsule; Refill: 0  Patient to follow up in 2 months Provider spent a total of 20 minutes with the patient  Meta Hatchet, PA 01/23/2021, 9:53 PM

## 2021-01-23 NOTE — Progress Notes (Signed)
Psychotherapy Progress Note [facility] Luan Moore, PhD LP  Patient ID: Tracy Robinson     MRN: [id number] Therapy format: Individual psychotherapy Date: 01/23/2021      Start: 10:15a     Stop: 11:03a     Time Spent: 48 min Location: In-person   Session narrative (presenting needs, interim history, self-report of stressors and symptoms, applications of prior therapy, status changes, and interventions made in session) Anxiety-provoking to figure out what he most wants to do today, but focused first on psychiatric appt this afternoon, which likely will focus on sertraline.  Seems to be doing OK on it, without mood instability.  Endorsed as stabilizing and a least partly effective.  Possibly due for a dose increase.  Quandary about flying to see aunt in [place], paralyzing trying to make the choice whether to buy the ticket.  Aunt has offered to donate her frequent flyer miles to underwrite the cost, though he does not want to depend on family, and knows some of the offer may be to prevent taxing his mother further.  Worked out in session to accept help, both as his own need and as a way of rewarding her generosity rather than fighting it.  Resolved he may ask her if she is truly OK giving it, but try not to refuse as a "nice" gesture -- let her give as she means to.  Last week met with [name], got the car issue worked out, and agreement to give her space.  Got through roommate release form.  Most potent emotional stress is missing the cats.  Unconditional love, very much misses the cuddling and scratching.  Mom's cat (Stump) is some help.  Knows he could work something out with [name] after some more cooling time, trying scrupulously right now to let her have space, including re. pet sharing.  Spent time with friend in [place] last week, good for him.  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  Alcohol has  been more pushy, though, and he sees it make him stay up (or nap), plus weight gain.  Not lost on him that alcoholism killed his father.  Discussed and affirmed own good reasons to abstain, possible deal with mother to hold or dispose of intoxicants, and comparative values of learned temperance versus abstinence.  Advocated the AA philosophy of "just for today", acknowledged his actual love of the taste of whiskey, the obvious antianxiety effects of alcohol, and beer tasting as an enjoyable activity apart from self-medicating.  Also counted the psychiatric offset of being on an antidepressant and a depressant at the same time.  Feeling more motivated to abstain.  Affirmed and encouraged.  Therapeutic modalities: Cognitive Behavioral Therapy, Solution-Oriented/Positive Psychology, Customer service manager, and Motivational Interviewing  Mental Status/Observations:  Appearance:   Casual     Behavior:  Appropriate  Motor:  Normal  Speech/Language:   Clear and Coherent  Affect:  Appropriate  Mood:  anxious and depressed  Thought process:  normal  Thought content:    WNL  Sensory/Perceptual disturbances:    WNL  Orientation:  Fully oriented  Attention:  Good    Concentration:  Fair  Memory:  WNL  Insight:    Good  Judgment:   Good  Impulse Control:  Fair   Risk Assessment: Danger to Self: No Self-injurious Behavior: No Danger to Others: No Physical Aggression / Violence: No Duty to Warn: No Access to Firearms a  concern: No  Assessment of progress:  progressing  Diagnosis:   ICD-10-CM   1. Generalized anxiety disorder  F41.1     2. Moderate episode of recurrent major depressive disorder (HCC)  F33.1     3. Attention deficit hyperactivity disorder (ADHD), unspecified ADHD type  F90.9     4. Relationship problem between partners  Z63.0     5. Polysubstance abuse ([place]) -- alcohol, marijuana  F19.10      Plan:  See about abstaining from alcohol for a time, emphasizing one day at a time,  ensuring he does not drink alone when he does.   Encourage support of mother and friends.  Option AA to learn more, obtain broader support. Endorse following through on travel to see aunt in [place], with three principled options for paying for the ticket (own dime, accept the gift, accept miles as a loan to be paid back or paid forward later) but if aunt want to donate, do not fight it or declare a debt she is not willing to hold. Other recommendations/advice as may be noted above Continue to utilize previously learned skills ad lib Maintain medication as prescribed and work faithfully with relevant prescriber(s) if any changes are desired or seem indicated Call the clinic on-call service, present to ER, or call 911 if any life-threatening psychiatric crisis Return in about 1 week (around 01/30/2021). Already scheduled visit in this office 01/30/2021.  Blanchie Serve, PhD Luan Moore, PhD LP Clinical Psychologist, [facility] [address], [place], [place] [address] [phone]

## 2021-01-24 MED ORDER — AMPHETAMINE-DEXTROAMPHET ER 30 MG PO CP24
30.0000 mg | ORAL_CAPSULE | Freq: Every day | ORAL | 0 refills | Status: DC
Start: 2021-02-04 — End: 2021-03-20

## 2021-01-30 ENCOUNTER — Ambulatory Visit (INDEPENDENT_AMBULATORY_CARE_PROVIDER_SITE_OTHER): Payer: 59 | Admitting: Psychiatry

## 2021-01-30 ENCOUNTER — Other Ambulatory Visit: Payer: Self-pay

## 2021-01-30 DIAGNOSIS — F331 Major depressive disorder, recurrent, moderate: Secondary | ICD-10-CM | POA: Diagnosis not present

## 2021-01-30 DIAGNOSIS — F909 Attention-deficit hyperactivity disorder, unspecified type: Secondary | ICD-10-CM

## 2021-01-30 DIAGNOSIS — Z630 Problems in relationship with spouse or partner: Secondary | ICD-10-CM | POA: Diagnosis not present

## 2021-01-30 DIAGNOSIS — F1910 Other psychoactive substance abuse, uncomplicated: Secondary | ICD-10-CM

## 2021-01-30 DIAGNOSIS — F411 Generalized anxiety disorder: Secondary | ICD-10-CM

## 2021-01-30 DIAGNOSIS — G479 Sleep disorder, unspecified: Secondary | ICD-10-CM

## 2021-01-30 NOTE — Progress Notes (Signed)
Psychotherapy Progress Note [facility] Marliss Czar, PhD LP  Patient ID: Tracy Robinson     MRN: [id number] Therapy format: Individual psychotherapy Date: 01/30/2021      Start: 11:09a     Stop: 11:56a     Time Spent: 47 min Location: In-person   Session narrative (presenting needs, interim history, self-report of stressors and symptoms, applications of prior therapy, status changes, and interventions made in session) Talked to aunt, got ticket to [facility], accepted as gift, anticipates Aug 14-23 to travel.    Still missing [name], wants to visit the cats but worries about creating awkwardness.  Explored available supports, as he stands to lose a working best friend after 12 years with [name].  Best friend [name], in [place], since 3rd grade, long history of sharing much, just can't do with disparaging [name], as they are friends, too.  Mother and sister tend to race to judgment of [name].  Aunt is supportive but relatively robotic.  Charles, in [place], may be recruitable -- tend to long times without contact, but very good listener, worth rewarming contact with him.  Acknowledges restless legs symptoms, enough to create wakefulness and daytime restlessness, leg muscles tired from holding positions.  Acknowledged medication is an option, discussed possible magnesium deficiency and strategies to test and try it, with side benefit of antianxiety action.  Already on vitamin D.    Confesses he got back into alcohol and pot hard the past week.  It becomes hypnotic once it starts, loitering there for hours, spoiling work and sleep.  Yesterday held off through the day, then when mother went to overnight work, he smoked 4x and drank 3x, spent time playing with a large collection of trading cards and the TV on.  Up till 3:30am.  Acknowledged as self-medicating, brainstormed alternative ("soothers without substances), and framed the task as less to apply "discipline" than to live out other choices before  resorting to them, to see himself doing something worth trying before just "doing" something.  Therapeutic modalities: Cognitive Behavioral Therapy, Solution-Oriented/Positive Psychology, Environmental manager, and Motivational Interviewing  Mental Status/Observations:  Appearance:   Casual     Behavior:  Appropriate  Motor:  Normal  Speech/Language:   Clear and Coherent  Affect:  Appropriate  Mood:  anxious  Thought process:  normal  Thought content:    WNL  Sensory/Perceptual disturbances:    WNL  Orientation:  Fully oriented  Attention:  Good    Concentration:  Good  Memory:  WNL  Insight:    Good  Judgment:   Good  Impulse Control:  Fair   Risk Assessment: Danger to Self: No Self-injurious Behavior: No Danger to Others: No Physical Aggression / Violence: No Duty to Warn: No Access to Firearms a concern: No  Assessment of progress:  situational setback(s)  Diagnosis:   ICD-10-CM   1. Generalized anxiety disorder  F41.1     2. Moderate episode of recurrent major depressive disorder (HCC)  F33.1     3. Attention deficit hyperactivity disorder (ADHD), unspecified ADHD type  F90.9     4. Relationship problem between partners  Z63.0     5. Polysubstance abuse (HCC) -- alcohol, marijuana  F19.10     6. Sleep disturbances  G47.9      Plan:  Cheat sheet of things can do before, or instead of, resorting to alcohol and pot: Walking music  Singing Stretching nap with weighted blanket Pet Stump Trading cards Video games TV Expressive writing, or speaking out  Gestalt chair conversation with a trusted friend or advisor Try out magnesium supplementation -- well-absorbed OTC formulation and/or Epsom salt soaks Option to improve antiinflammatory strategy, e.g., omega 3 supplementation Other recommendations/advice as may be noted above Continue to utilize previously learned skills ad lib Maintain medication as prescribed and work faithfully with relevant prescriber(s) if any  changes are desired or seem indicated Call the clinic on-call service, present to ER, or call 911 if any life-threatening psychiatric crisis Return in about 1 week (around 02/06/2021). Already scheduled visit in this office 02/06/2021.  [name], PhD Marliss Czar, PhD LP Clinical Psychologist, [facility] [address], [address] [place], [place] [address] [phone]

## 2021-02-06 ENCOUNTER — Ambulatory Visit (INDEPENDENT_AMBULATORY_CARE_PROVIDER_SITE_OTHER): Payer: 59 | Admitting: Psychiatry

## 2021-02-06 ENCOUNTER — Other Ambulatory Visit: Payer: Self-pay

## 2021-02-06 DIAGNOSIS — F1910 Other psychoactive substance abuse, uncomplicated: Secondary | ICD-10-CM

## 2021-02-06 DIAGNOSIS — F331 Major depressive disorder, recurrent, moderate: Secondary | ICD-10-CM

## 2021-02-06 DIAGNOSIS — G479 Sleep disorder, unspecified: Secondary | ICD-10-CM

## 2021-02-06 DIAGNOSIS — F411 Generalized anxiety disorder: Secondary | ICD-10-CM | POA: Diagnosis not present

## 2021-02-06 DIAGNOSIS — F909 Attention-deficit hyperactivity disorder, unspecified type: Secondary | ICD-10-CM | POA: Diagnosis not present

## 2021-02-06 DIAGNOSIS — R69 Illness, unspecified: Secondary | ICD-10-CM

## 2021-02-06 NOTE — Progress Notes (Signed)
Psychotherapy Progress Note [facility] Tracy Czar, PhD LP  Patient ID: Tracy Robinson     MRN: [id number] Therapy format: Individual psychotherapy Date: 02/06/2021      Start: 10:30a     Stop: 11:19a     Time Spent: 49 min Location: In-person   Session narrative (presenting needs, interim history, self-report of stressors and symptoms, applications of prior therapy, status changes, and interventions made in session) Has tried to use the list of alternatives developed last time.  Finds that petting mother's cat very helpful for overstim problems, and sounds like it serves as a mindfulness meditation.  TV has been an effective escape, but prone to binge on it.  Acknowledged, and encouraged that it is at least trading up.  Music and dancing have been helpful, especially as he wakes up tight.  Hitting the gym in mom's garage some.  This week with his trading cards, found it was fraught with tension about whether it's OK to be doing that, and so it didn't serve to release tension.  Suggested he take an authoritative approach to recreating or self-soothing, claiming his right to do so, and trusting that a full "yes" is always better than a guilty/divide one.  Found that he was less able to defer substances in the evening, still compulsive with alcohol and pot but substantially less.  Attributed it to people being home this week, which tends to be more agitating.  Discussed the value of enforcing a hard-core abstinence from alcohol to see what difference it makes.    Saw psychiatry after last visit, maintained meds.  Still on sertraline 150mg  QAM, Adderall 30 ER, trazodone 100 QPM.  Consistently on vitamin D 1000 IU and a MVI with extra magnesium.  Might be seeing less leg restlessness with the magnesium.    Forecast how to cope with a new attempt at alcohol abstinence.  Agreed that some vigorous exercise would be one good response to feeling squirrelly.  Can use herbal tea, or even  green tea.  Practicing diaphragmatic breathing.  Using the mindfulness exercise (5 things you see, 4 you hear, etc.) and stepping outside for distance vision.  Taught a "special circumstances" expressive breathing technique ("ohhhhh......shiiiiiiiit").  Mainly, alcohol desire is likely when he wants to relax/chil, when it is time to sleep but he is discontent, or preparing to work on writing and in conflict about it.  Gets drawn to binge eating, too -- snack foods mainly.  Discussed low-carb and keto-friendly grab foods as possible alternatives, less compulsive, and theory of carb addiction..    For next week, wants to discuss the dynamic between himself, mother, and sister , including niece Tracy Robinson.  Agreed.  Therapeutic modalities: Cognitive Behavioral Therapy, Solution-Oriented/Positive Psychology, Tracy Robinson, and Motivational Interviewing  Mental Status/Observations:  Appearance:   Casual     Behavior:  Appropriate  Motor:  Normal  Speech/Language:   Clear and Coherent  Affect:  Appropriate  Mood:  anxious and dysthymic  Thought process:  normal  Thought content:    WNL  Sensory/Perceptual disturbances:    WNL  Orientation:  Fully oriented  Attention:  Good    Concentration:  Fair  Memory:  WNL  Insight:    Good  Judgment:   Good  Impulse Control:  Fair   Risk Assessment: Danger to Self: No Self-injurious Behavior: No Danger to Others: No Physical Aggression / Violence: No Duty to Warn: No Access to Firearms a concern: No  Assessment of progress:  progressing  Diagnosis:   ICD-10-CM   1. Generalized anxiety disorder  F41.1     2. Moderate episode of recurrent major depressive disorder (HCC)  F33.1     3. Attention deficit hyperactivity disorder (ADHD), unspecified ADHD type  F90.9     4. Polysubstance abuse (HCC) -- alcohol, marijuana  F19.10     5. Sleep disturbances  G47.9     6. r/o Restless Legs Syndrome, magnesium deficiency  R69      Plan:  Probably  increase vitamin D Try 1 week w/o resorting to alcohol -- use substitute behaviors liberally from cheat sheet: Walking music Singing Stretching nap with weighted blanket Pet cat Stump Trading cards Video games TV Expressive writing, or speaking out Gestalt chair conversation with a trusted friend or advisor Continue magnesium, omega 3 supplementation Consider more carb control Come back to family dynamic next session Other recommendations/advice as may be noted above Continue to utilize previously learned skills ad lib Maintain medication as prescribed and work faithfully with relevant prescriber(s) if any changes are desired or seem indicated Call the clinic on-call service, present to ER, or call 911 if any life-threatening psychiatric crisis No follow-ups on file. Already scheduled visit in this office 02/13/2021.  Tracy Fries, PhD Tracy Czar, PhD LP Clinical Psychologist, [facility] [address] [place], [place] [address] [phone]

## 2021-02-13 ENCOUNTER — Ambulatory Visit (INDEPENDENT_AMBULATORY_CARE_PROVIDER_SITE_OTHER): Payer: 59 | Admitting: Psychiatry

## 2021-02-13 ENCOUNTER — Other Ambulatory Visit: Payer: Self-pay

## 2021-02-13 DIAGNOSIS — Z638 Other specified problems related to primary support group: Secondary | ICD-10-CM

## 2021-02-13 DIAGNOSIS — F1910 Other psychoactive substance abuse, uncomplicated: Secondary | ICD-10-CM | POA: Diagnosis not present

## 2021-02-13 DIAGNOSIS — F331 Major depressive disorder, recurrent, moderate: Secondary | ICD-10-CM | POA: Diagnosis not present

## 2021-02-13 DIAGNOSIS — F411 Generalized anxiety disorder: Secondary | ICD-10-CM

## 2021-02-13 DIAGNOSIS — G479 Sleep disorder, unspecified: Secondary | ICD-10-CM

## 2021-02-13 DIAGNOSIS — F909 Attention-deficit hyperactivity disorder, unspecified type: Secondary | ICD-10-CM | POA: Diagnosis not present

## 2021-02-13 DIAGNOSIS — R69 Illness, unspecified: Secondary | ICD-10-CM

## 2021-02-13 NOTE — Progress Notes (Signed)
Psychotherapy Progress Note [facility] Marliss Czar, PhD LP  Patient ID: Tracy Robinson     MRN: [id number] Therapy format: Individual psychotherapy Date: 02/13/2021      Start: 10:14a     Stop: 1:02a     Time Spent: 48 min Location: In-person   Session narrative (presenting needs, interim history, self-report of stressors and symptoms, applications of prior therapy, status changes, and interventions made in session) Not a good week for control of substances.  Alcohol and pot have been pretty easy to go into, coping with the stimulation of [name] in the house lately.  Some nights 3 drinks.  Assured its not about shame, it's about improving choice, and honesty about it all is paramount, including the decision to indulge when he does.    When Tracy Robinson is there, he feels surges of responsibility to watch her, contain her, and mother's tendency to pull Tracy Robinson into auxiliary responsibility on call.  Finds himself evading contact to keep from being roped in.  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  Perpetually hectic existence seems to have trained Tracy Robinson to be highly attentive and concerned, therefore distractible.  Has not quite been to the point of telling her he's concerned for her, though some boundaries have been agreed for his own sake.  Worried for her health, morale, and financial security altogether, and that she enables Tracy Robinson to their mutual detriment.  Tracy Robinson, for her part, is anxiety dominated but can recognize the problems.    Discussed the value of acknowledging concerns with both mother and sister, to release his own worry, transcend family denial, and be more effective working out ills.  Knows he has at times felt like being completely out of the family picture, "running" as it were to [place] for grad school.  Normalized  and reframed.  Currently not making progress on academic work, but no particular pressure now, either.    Did get worked through some room arrangements at [employer], putting away some clutter and unpacking some needed things.  Feels more functional, affirmed stopping to make the effort.  C/o difficulty with sleep pattern, staying up too late.  Advised on blue light control (electronic screen settings, option to use amber lenses) and radical, intentional consent to turn loose of the day, forgive its regrets, declare no need to "make up" work or "milk" recreation time, and let sleep and then tomorrow just come in their own way.  Therapeutic modalities: Cognitive Behavioral Therapy, Solution-Oriented/Positive Psychology, Environmental manager, and Motivational Interviewing  Mental Status/Observations:  Appearance:   Casual     Behavior:  Appropriate  Motor:  Normal  Speech/Language:   Clear and Coherent  Affect:  Appropriate  Mood:  anxious and dysthymic  Thought process:  normal  Thought content:    WNL  Sensory/Perceptual disturbances:    WNL  Orientation:  Fully oriented  Attention:  Good    Concentration:  Fair  Memory:  WNL  Insight:    Good  Judgment:   Good  Impulse Control:  Fair   Risk Assessment: Danger to Self: No Self-injurious Behavior: No Danger to Others: No Physical Aggression / Violence: No Duty to Warn: No Access to Firearms a concern: No  Assessment of progress:  progressing  Diagnosis:   ICD-10-CM   1. Generalized anxiety disorder  F41.1     2. Moderate episode  of recurrent major depressive disorder (HCC)  F33.1     3. Attention deficit hyperactivity disorder (ADHD), unspecified ADHD type  F90.9     4. Polysubstance abuse (HCC) -- alcohol, marijuana  F19.10     5. Sleep disturbances  G47.9     6. r/o Restless Legs Syndrome, magnesium deficiency  R69     7. Relationship problem with family members  Z63.8      Plan:  Try to abstain from alcohol and  pot, use alternatives before deciding to indulge, but avow it openly if he does Re. Family, self-affirm that just because he learned to be vigilant does not mean he has to take responsibility Declutter further as able Sleep hygiene and blue light management tactics Other recommendations/advice as may be noted above Continue to utilize previously learned skills ad lib Maintain medication as prescribed and work faithfully with relevant prescriber(s) if any changes are desired or seem indicated Call the clinic on-call service, present to ER, or call 911 if any life-threatening psychiatric crisis Return in about 1 week (around 02/20/2021). Already scheduled visit in this office 02/23/2021.  [name], PhD Marliss Czar, PhD LP Clinical Psychologist, [facility] [address] [place], [place] [address] [phone]

## 2021-02-23 ENCOUNTER — Other Ambulatory Visit: Payer: Self-pay

## 2021-02-23 ENCOUNTER — Ambulatory Visit (INDEPENDENT_AMBULATORY_CARE_PROVIDER_SITE_OTHER): Payer: 59 | Admitting: Psychiatry

## 2021-02-23 DIAGNOSIS — F331 Major depressive disorder, recurrent, moderate: Secondary | ICD-10-CM | POA: Diagnosis not present

## 2021-02-23 DIAGNOSIS — F411 Generalized anxiety disorder: Secondary | ICD-10-CM

## 2021-02-23 DIAGNOSIS — R69 Illness, unspecified: Secondary | ICD-10-CM

## 2021-02-23 DIAGNOSIS — F1910 Other psychoactive substance abuse, uncomplicated: Secondary | ICD-10-CM | POA: Diagnosis not present

## 2021-02-23 DIAGNOSIS — G479 Sleep disorder, unspecified: Secondary | ICD-10-CM

## 2021-02-23 DIAGNOSIS — F909 Attention-deficit hyperactivity disorder, unspecified type: Secondary | ICD-10-CM

## 2021-02-23 DIAGNOSIS — Z638 Other specified problems related to primary support group: Secondary | ICD-10-CM

## 2021-02-23 NOTE — Progress Notes (Signed)
Psychotherapy Progress Note [facility] Marliss Czar, PhD LP  Patient ID: Tracy Robinson     MRN: [id number] Therapy format: Individual psychotherapy Date: 02/23/2021      Start: 9:11a     Stop: 9:58a     Time Spent: 47 min Location: In-person   Session narrative (presenting needs, interim history, self-report of stressors and symptoms, applications of prior therapy, status changes, and interventions made in session) Nice getaway this weekend with college friends to [employer].  Grateful for [employer].  Dinner at [employer].  3 workouts, including collaborative bag work with mother, who wanted to vent about her ex-boyfriend.  Knows Tracy Robinson will be in the house 4 days next week, and got ahead of it with mother about preventing surprise responsibilities on him.    Meanwhile, asked his sister Tracy Robinson and mother, both, about entering family therapy.  Tracy Robinson feels open to it, but he feels he botched the introduction to mother, inadvertently made her feel shamed.  Discussed deeper wishes for her behavior and broached the possibility of a do-over, stating what he would like to have different and offering therapy as a way to get to it, not a judgment.  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  Feels good.  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  Affirmed and encouraged.  Has been practicing a little expressive freedom, saying out loud, "I don't want to do this" and finding he can more.  Will be taking a trip to [facility] 8/13-23.  Anxiety about travel, but would like to open up to aunt more about family life.  Would also like to work on his academic job applications while there, since she is an Sports administrator.  Would like to make it through academic application process.  Would like a tenure track position.  Would like to become more ready to share difficult subject matter with aunt, like his father experience.  Agreed he may, just a matter  of asking for time, possibly framing it as something he is trying to do for his own good, or for therapy.  Therapeutic modalities: Cognitive Behavioral Therapy, Solution-Oriented/Positive Psychology, Environmental manager, and Motivational Interviewing  Mental Status/Observations:  Appearance:   Casual     Behavior:  Appropriate  Motor:  Normal  Speech/Language:   Clear and Coherent  Affect:  Appropriate  Mood:  anxious  Thought process:  normal  Thought content:    WNL  Sensory/Perceptual disturbances:    WNL  Orientation:  Fully oriented  Attention:  Good    Concentration:  Good  Memory:  WNL  Insight:    Good  Judgment:   Good  Impulse Control:  Fair   Risk Assessment: Danger to Self: No Self-injurious Behavior: No Danger to Others: No Physical Aggression / Violence: No Duty to Warn: No Access to Firearms a concern: No  Assessment of progress:  progressing  Diagnosis:   ICD-10-CM   1. Generalized anxiety disorder  F41.1     2. Moderate episode of recurrent major depressive disorder (HCC)  F33.1     3. Polysubstance abuse (HCC) -- alcohol, marijuana  F19.10     4. Attention deficit hyperactivity disorder (ADHD), unspecified ADHD type  F90.9     5. Sleep disturbances  G47.9     6. r/o Restless Legs Syndrome, magnesium deficiency  R69     7. Relationship problem with family members  Z63.8      Plan:  OK to invite  mother to therapy, or just ask for a behavior change.  Suggest having a "do over" at asking her. Open to family consultation including sister Challenge to make a packing list and decidedly do a few items each day as exercise in moderation and not procrastinating Keep using expressive freedom ("I don't want to") to free up motivation for procrastinated tasks, especially academic Maintain supplements and sleep hygiene as able Other recommendations/advice as may be noted above Continue to utilize previously learned skills ad lib Maintain medication as prescribed  and work faithfully with relevant prescriber(s) if any changes are desired or seem indicated Call the clinic on-call service, present to ER, or call 911 if any life-threatening psychiatric crisis Return for session(s) already scheduled. Already scheduled visit in this office 02/26/2021.  [name], PhD Marliss Czar, PhD LP Clinical Psychologist, [facility] [address], [address] [place], [place] [address] [phone]

## 2021-02-26 ENCOUNTER — Ambulatory Visit: Payer: 59 | Admitting: Psychiatry

## 2021-03-19 ENCOUNTER — Other Ambulatory Visit: Payer: Self-pay

## 2021-03-19 ENCOUNTER — Ambulatory Visit (INDEPENDENT_AMBULATORY_CARE_PROVIDER_SITE_OTHER): Payer: 59 | Admitting: Psychiatry

## 2021-03-19 DIAGNOSIS — F331 Major depressive disorder, recurrent, moderate: Secondary | ICD-10-CM | POA: Diagnosis not present

## 2021-03-19 DIAGNOSIS — M549 Dorsalgia, unspecified: Secondary | ICD-10-CM

## 2021-03-19 DIAGNOSIS — Z630 Problems in relationship with spouse or partner: Secondary | ICD-10-CM

## 2021-03-19 DIAGNOSIS — F411 Generalized anxiety disorder: Secondary | ICD-10-CM

## 2021-03-19 DIAGNOSIS — F1910 Other psychoactive substance abuse, uncomplicated: Secondary | ICD-10-CM

## 2021-03-19 DIAGNOSIS — G479 Sleep disorder, unspecified: Secondary | ICD-10-CM | POA: Diagnosis not present

## 2021-03-19 DIAGNOSIS — F909 Attention-deficit hyperactivity disorder, unspecified type: Secondary | ICD-10-CM | POA: Diagnosis not present

## 2021-03-19 NOTE — Progress Notes (Signed)
Psychotherapy Progress Note [facility] Luan Moore, PhD LP  Patient ID: Tracy Robinson     MRN: [id number] Therapy format: Individual psychotherapy Date: 03/19/2021      Start: 9:14a     Stop: 10:02a     Time Spent: 48 min Location: In-person   Session narrative (presenting needs, interim history, self-report of stressors and symptoms, applications of prior therapy, status changes, and interventions made in session) Very tired, very little sleep last night, and on duty to watch [facility].  3 of last 4 night short sleep, 4 nights ago none (Adderall too late), then 12 hour sleep next night.  Would like to improve his medication and routine.  Adderall XR is "core", but hates the feeling of coming down from it, tends to make his bad decisions about self-medication (drugs, games) then.  Felt relieved on some days when not taking it, actually, though taking it does calm down distracting thoughts, and some anxiety.  Discussed possibilities Strattera (bad experience) and Wellbutrin, recommend talk to psychiatrist.  Advised against pot use on grounds of stability in motivation.    Sleep remains difficult.  Likes the idea of "consent" to sleep.  Tends to get focused on discomfort -- warmth, discomfort.  Discussed stretching, changing weight and coverage of the covers, possible switch to box fan vs. clicking overhead. Fn.  Keeps objects in bed with him, may be a little more wakeful trying not to disturb them.    Trip to see A in [place] was good.  Hospitality was wonderful, relaxing.  Anxiety down, there and since.  Using belly breathing for relaxation OK.  Less tensing of back, etc.  Met [name] for coffee, wanted to talk about getting back together, but not open to it. Helped to get more finality to it, and used the opportunity to confess two past indiscretions without it being passive-aggressive.  Discussed motivations and relief obtained.  Therapeutic modalities: Cognitive Behavioral  Therapy, Solution-Oriented/Positive Psychology, and Motivational Interviewing  Mental Status/Observations:  Appearance:   Casual     Behavior:  Appropriate  Motor:  Normal  Speech/Language:   Clear and Coherent  Affect:  Appropriate  Mood:  tired  Thought process:  normal  Thought content:    WNL  Sensory/Perceptual disturbances:    WNL  Orientation:  Fully oriented  Attention:  Good    Concentration:  Fair  Memory:  WNL  Insight:    Good  Judgment:   Good  Impulse Control:  Fair   Risk Assessment: Danger to Self: No Self-injurious Behavior: No Danger to Others: No Physical Aggression / Violence: No Duty to Warn: No Access to Firearms a concern: No  Assessment of progress:  progressing  Diagnosis:   ICD-10-CM   1. Generalized anxiety disorder  F41.1     2. Attention deficit hyperactivity disorder (ADHD), unspecified ADHD type  F90.9     3. Sleep disturbances  G47.9     4. Moderate episode of recurrent major depressive disorder (HCC)  F33.1     5. Polysubstance abuse ([place]) -- alcohol, marijuana  F19.10     6. Relationship problem between partners  Z63.0     7. Back pain, unspecified back location, unspecified back pain laterality, unspecified chronicity  M54.9      Plan:  Work on tangible obstacles to sleep -- temp, cover, objects in bed, set up makeshift night table if need, set up box fan if need for masking noise Ensure Adderall is on time or skipped, to  prevent overstimulated bedtime.  Consider dose adjustment or possible switch off stimulant to take care of rebound appetite late in the day, and establish a calorie curfew for benefit to deep sleep Option for resistance to toothbrushing -- try without toothpaste as a "protest", or go for the enjoyment of it rather than the obligation ("good reasons") General recommendations to be off the fence about applying time -- working/playing, feeding/fasting, sleep/wake -- and frame choices in terms of his own good reasons,  not just "have tos" Other recommendations/advice as may be noted above Continue to utilize previously learned skills ad lib Maintain medication as prescribed and work faithfully with relevant prescriber(s) if any changes are desired or seem indicated Call the clinic on-call service, present to ER, or call 911 if any life-threatening psychiatric crisis Return for session(s) already scheduled. Already scheduled visit in this office 03/27/2021.  Blanchie Serve, PhD Luan Moore, PhD LP Clinical Psychologist, [facility] [address], [place], [place] [address] [phone]

## 2021-03-20 ENCOUNTER — Encounter (HOSPITAL_COMMUNITY): Payer: Self-pay | Admitting: Physician Assistant

## 2021-03-20 ENCOUNTER — Telehealth (INDEPENDENT_AMBULATORY_CARE_PROVIDER_SITE_OTHER): Payer: 59 | Admitting: Physician Assistant

## 2021-03-20 DIAGNOSIS — G479 Sleep disorder, unspecified: Secondary | ICD-10-CM

## 2021-03-20 DIAGNOSIS — F411 Generalized anxiety disorder: Secondary | ICD-10-CM

## 2021-03-20 DIAGNOSIS — F909 Attention-deficit hyperactivity disorder, unspecified type: Secondary | ICD-10-CM | POA: Diagnosis not present

## 2021-03-20 DIAGNOSIS — F331 Major depressive disorder, recurrent, moderate: Secondary | ICD-10-CM | POA: Diagnosis not present

## 2021-03-20 MED ORDER — AMPHETAMINE-DEXTROAMPHET ER 30 MG PO CP24
30.0000 mg | ORAL_CAPSULE | Freq: Every day | ORAL | 0 refills | Status: DC
Start: 2021-03-20 — End: 2021-05-03

## 2021-03-20 MED ORDER — TRAZODONE HCL 100 MG PO TABS: 100.0000 mg | ORAL_TABLET | Freq: Every day | ORAL | 1 refills | Status: DC

## 2021-03-20 MED ORDER — SERTRALINE HCL 100 MG PO TABS: 200.0000 mg | ORAL_TABLET | Freq: Every day | ORAL | 2 refills | Status: DC

## 2021-03-20 NOTE — Progress Notes (Signed)
BH MD/PA/NP OP Progress Note  Virtual Visit via Telephone Note  I connected [name] on 03/20/21 at  3:30 PM EDT by telephone and verified that I am speaking with the correct person using two identifiers.  Location: Patient: Home Provider: Clinic   I discussed the limitations, risks, security and privacy concerns of performing an evaluation and management service by telephone and the availability of in person appointments. I also discussed with the patient that there may be a patient responsible charge related to this service. The patient expressed understanding and agreed to proceed.  Follow Up Instructions:  I discussed the assessment and treatment plan with the patient. The patient was provided an opportunity to ask questions and all were answered. The patient agreed with the plan and demonstrated an understanding of the instructions.   The patient was advised to call back or seek an in-person evaluation if the symptoms worsen or if the condition fails to improve as anticipated.  I provided 25 minutes of non-face-to-face time during this encounter.  Meta Hatchet, PA    03/20/2021 7:26 PM Tracy Robinson  MRN:  [id number]  Chief Complaint: Follow up and medication management  HPI:     Visit Diagnosis:    ICD-10-CM   1. Sleep disturbances  G47.9 traZODone (DESYREL) 100 MG tablet    2. Generalized anxiety disorder  F41.1 sertraline (ZOLOFT) 100 MG tablet    3. Moderate episode of recurrent major depressive disorder (HCC)  F33.1 sertraline (ZOLOFT) 100 MG tablet    4. Attention deficit hyperactivity disorder (ADHD), unspecified ADHD type  F90.9 amphetamine-dextroamphetamine (ADDERALL XR) 30 MG 24 hr capsule      Past Psychiatric History:  Major depressive disorder Generalized anxiety disorder ADHD Sleep disturbances  Past Medical History: History reviewed. No pertinent past medical history. History reviewed. No pertinent surgical history.  Family Psychiatric  History:  Father (deceased) - Alcoholism Sister - mood disorder. Patient states that his sister has also struggled with anorexia and bulimia  Family History: History reviewed. No pertinent family history.  Social History:  Social History   Socioeconomic History   Marital status: Single    Spouse name: Not on file   Number of children: Not on file   Years of education: Not on file   Highest education level: Not on file  Occupational History   Not on file  Tobacco Use   Smoking status: Never   Smokeless tobacco: Never  Substance and Sexual Activity   Alcohol use: Not on file   Drug use: Not on file   Sexual activity: Not on file  Other Topics Concern   Not on file  Social History Narrative   Not on file   Social Determinants of Health   Financial Resource Strain: Not on file  Food Insecurity: Not on file  Transportation Needs: Not on file  Physical Activity: Not on file  Stress: Not on file  Social Connections: Not on file    Allergies: No Known Allergies  Metabolic Disorder Labs: No results found for: HGBA1C, MPG No results found for: PROLACTIN No results found for: CHOL, TRIG, HDL, CHOLHDL, VLDL, LDLCALC No results found for: TSH  Therapeutic Level Labs: No results found for: LITHIUM No results found for: VALPROATE No components found for:  CBMZ  Current Medications: Current Outpatient Medications  Medication Sig Dispense Refill   amphetamine-dextroamphetamine (ADDERALL XR) 30 MG 24 hr capsule Take 1 capsule (30 mg total) by mouth daily. 30 capsule 0   sertraline (ZOLOFT)  100 MG tablet Take 2 tablets (200 mg total) by mouth daily. 60 tablet 2   traZODone (DESYREL) 100 MG tablet Take 1 tablet (100 mg total) by mouth at bedtime. 30 tablet 1   No current facility-administered medications for this visit.     Musculoskeletal: Strength & Muscle Tone: Unable to assess due to telemedicine visit Gait & Station: Unable to assess due to telemedicine  visit Patient leans: Unable to assess due to telemedicine visit  Psychiatric Specialty Exam: Review of Systems  Psychiatric/Behavioral:  Positive for sleep disturbance. Negative for decreased concentration, dysphoric mood, hallucinations, self-injury and suicidal ideas. The patient is nervous/anxious. The patient is not hyperactive.    There were no vitals taken for this visit.There is no height or weight on file to calculate BMI.  General Appearance: Unable to assess due to telemedicine visit  Eye Contact:  Unable to assess due to telemedicine visit  Speech:  Clear and Coherent and Normal Rate  Volume:  Normal  Mood:  Anxious and Depressed  Affect:  Congruent and Depressed  Thought Process:  Coherent, Goal Directed, and Descriptions of Associations: Intact  Orientation:  Full (Time, Place, and Person)  Thought Content: WDL   Suicidal Thoughts:  No  Homicidal Thoughts:  No  Memory:  Immediate;   Good Recent;   Good Remote;   Good  Judgement:  Good  Insight:  Good  Psychomotor Activity:  Normal  Concentration:  Concentration: Good and Attention Span: Good  Recall:  Good  Fund of Knowledge: Good  Language: Good  Akathisia:  NA  Handed:  Right  AIMS (if indicated): not done  Assets:  Communication Skills Desire for Improvement Housing Social Support Vocational/Educational  ADL's:  Intact  Cognition: WNL  Sleep:  Fair   Screenings: GAD-7    Flowsheet Row Video Visit from 03/20/2021 in [facility] Office Visit from 01/23/2021 in [facility] Office Visit from 11/22/2020 in [facility] Office Visit from 09/22/2020 in [facility]  Total GAD-7 Score 19 18 19 20       PHQ2-9    Flowsheet Row Video Visit from 03/20/2021 in [facility] Office Visit from 01/23/2021 in [facility] Office Visit from 11/22/2020 in [facility] Office Visit from 09/22/2020 in [facility]  PHQ-2 Total Score 6 6 5 5   PHQ-9 Total Score 22 20 19 18       Flowsheet Row Video Visit from 03/20/2021 in [facility] Office Visit from 01/23/2021 in [facility] Office Visit from 11/22/2020 in [facility]  C-SSRS RISK CATEGORY No Risk No Risk No Risk        Assessment and Plan:     1. Sleep disturbances  - traZODone (DESYREL) 100 MG tablet; Take 1 tablet (100 mg total) by mouth at bedtime.  Dispense: 30 tablet; Refill: 1  2. Generalized anxiety disorder  - sertraline (ZOLOFT) 100 MG tablet; Take 2 tablets (200 mg total) by mouth daily.  Dispense: 60 tablet; Refill: 2  3. Moderate episode of recurrent major depressive disorder (HCC)  - sertraline (ZOLOFT) 100 MG tablet; Take 2 tablets (200 mg total) by mouth daily.  Dispense: 60 tablet; Refill: 2  4. Attention deficit hyperactivity disorder (ADHD), unspecified ADHD type  - amphetamine-dextroamphetamine (ADDERALL XR) 30 MG 24 hr capsule; Take 1 capsule (30 mg total) by  mouth daily.  Dispense: 30 capsule; Refill: 0  Patient to follow up in 2 months Provider spent a total of 25 minutes with the patient/reviewing patient's chart  Meta Hatchet, PA 03/20/2021, 7:26 PM

## 2021-03-27 ENCOUNTER — Ambulatory Visit (INDEPENDENT_AMBULATORY_CARE_PROVIDER_SITE_OTHER): Payer: 59 | Admitting: Psychiatry

## 2021-03-27 ENCOUNTER — Encounter: Payer: Self-pay | Admitting: Psychiatry

## 2021-03-27 DIAGNOSIS — F411 Generalized anxiety disorder: Secondary | ICD-10-CM

## 2021-03-27 DIAGNOSIS — F331 Major depressive disorder, recurrent, moderate: Secondary | ICD-10-CM

## 2021-03-27 DIAGNOSIS — F1910 Other psychoactive substance abuse, uncomplicated: Secondary | ICD-10-CM | POA: Diagnosis not present

## 2021-03-27 DIAGNOSIS — F909 Attention-deficit hyperactivity disorder, unspecified type: Secondary | ICD-10-CM | POA: Diagnosis not present

## 2021-03-27 DIAGNOSIS — Z638 Other specified problems related to primary support group: Secondary | ICD-10-CM

## 2021-03-27 DIAGNOSIS — G479 Sleep disorder, unspecified: Secondary | ICD-10-CM

## 2021-03-27 NOTE — Progress Notes (Signed)
Psychotherapy Progress Note [facility] Marliss Czar, PhD LP  Patient ID: Tracy Robinson     MRN: [id number] Therapy format: Individual psychotherapy Date: 03/27/2021      Start: 10:20a     Stop: 10:53a     Time Spent: 33 min Location: Telehealth visit -- I connected with this patient by an approved telecommunication method (video), with his informed consent, and verifying identity and patient privacy.  I was located at my office and patient at his  vacation home .  As needed, we discussed the limitations, risks, and security and privacy concerns associated with telehealth service, including the availability and conditions which currently govern in-person appointments and the possibility that 3rd-party payment may not be fully guaranteed and he may be responsible for charges.  After he indicated understanding, we proceeded with the session.  Also discussed treatment planning, as needed, including ongoing verbal agreement with the plan, the opportunity to ask and answer all questions, his demonstrated understanding of instructions, and his readiness to call the office should symptoms worsen or he feels he is in a crisis state and needs more immediate and tangible assistance.   Session narrative (presenting needs, interim history, self-report of stressors and symptoms, applications of prior therapy, status changes, and interventions made in session) [place]  Made change late, on realizing he was scheduled before he would be back.  Late start due to overrun earlier patient, agreed to truncated session.  Talked with psychiatrist about change of strategy with stimulant, advised stick with it until dissertation done, then take a drug holiday to see if he can work without it, or at least blunt the withdrawal effect.  Content with that, does feel like it helps, and since being able to label evening withdrawal effect, he has been able to successfully  divert attention to [facility] game or something noncaloric and nonaddictive, and treat the moment behaviorally, not resorting to substances.  Feels this is enough to take care of the urgent feeling during comedown.  Affirmed and encouraged.  Is falling asleep to podcasts, waking 2-3 hours into them.  Trying to practice consent for this day to end.  Does have some intrusive thoughts and memories that may come up at night.  Recommended brain dump, journal, acknowledge, and continue to use diaphragmatic breathing to release tension, sometimes using foam roller to loosen abdomen and lower back.  Continue to advise decluttering the bed -- no triggers, no distractions, nothing to take care of.    Been really sad lately.  Feels he can't connect with the family he has, and mother's tension/anxiety have been up.  Mother tends to dump easily about her own frustrations with his sister [name].  Recommended confirm with mother whether she means for him to try to solve her troubles or just listen.  Irritated by sister palming off care of her daughter, but mostly enjoying hanging out with family members.  Framed task of wearing off the Pavlovian conditioning of family for when he was an alienated adolescent, making new experiences and memories with some of the same people and places.  Framed also that clarifying expectations and getting more of a conscious understanding also helps frame that this is an adult-adult relationship, not a helpless child role.  Therapeutic modalities: Cognitive Behavioral Therapy, Solution-Oriented/Positive Psychology, Ego-Supportive, and Assertiveness/Communication  Mental Status/Observations:  Appearance:   Casual     Behavior:  Appropriate  Motor:  Normal  Speech/Language:   Clear and  Coherent  Affect:  Appropriate, outwardly pleasant  Mood:  anxious and depressed, responsive  Thought process:  normal  Thought content:    Rumination, responsive  Sensory/Perceptual disturbances:     WNL  Orientation:  Fully oriented  Attention:  Good    Concentration:  Good  Memory:  WNL  Insight:    Fair  Judgment:   Good  Impulse Control:  Fair   Risk Assessment: Danger to Self: No Self-injurious Behavior: No Danger to Others: No Physical Aggression / Violence: No Duty to Warn: No Access to Firearms a concern: No  Assessment of progress:  progressing  Diagnosis:   ICD-10-CM   1. Generalized anxiety disorder  F41.1     2. Moderate episode of recurrent major depressive disorder (HCC)  F33.1     3. Attention deficit hyperactivity disorder (ADHD), unspecified ADHD type  F90.9     4. Polysubstance abuse (HCC) -- alcohol, marijuana  F19.10     5. Relationship problem with family members  Z63.8     6. Sleep disturbances  G47.9      Plan:  Continue to label and redirect for evening withdrawal from stimulant, for reduced distress and substance abuse triggering Sleep hygiene measures: Be more deliberate about not leaving audio on when in range of going to sleep Use brain dump/journal to acknowledge intrusive thoughts and feelings Continue to use diaphragmatic breathing to release tension Foam roller or other physical tactics to loosen abdomen and lower back, improve bodily comfort Declutter the bed, to eliminate triggers to watch out, take care of stuff Perception-check with mother whether she means for him to take on her burdens or just venting, agree on parameters as needed Self-affirm that adult-adult conversation helps break down the impression of being stuck, suffocated Other recommendations/advice as may be noted above Continue to utilize previously learned skills ad lib Maintain medication as prescribed and work faithfully with relevant prescriber(s) if any changes are desired or seem indicated Call the clinic on-call service, 988/hotline, present to ER, or call 911 if any life-threatening psychiatric crisis Return for time as available, session(s) already  scheduled. Already scheduled visit in this office 04/02/2021.  Tracy Fries, PhD Marliss Czar, PhD LP Clinical Psychologist, [facility] [address], [address] [place], [place] [address] [phone]

## 2021-04-02 ENCOUNTER — Ambulatory Visit (INDEPENDENT_AMBULATORY_CARE_PROVIDER_SITE_OTHER): Payer: 59 | Admitting: Psychiatry

## 2021-04-02 ENCOUNTER — Other Ambulatory Visit: Payer: Self-pay

## 2021-04-02 DIAGNOSIS — F411 Generalized anxiety disorder: Secondary | ICD-10-CM

## 2021-04-02 DIAGNOSIS — F331 Major depressive disorder, recurrent, moderate: Secondary | ICD-10-CM | POA: Diagnosis not present

## 2021-04-02 DIAGNOSIS — G479 Sleep disorder, unspecified: Secondary | ICD-10-CM

## 2021-04-02 DIAGNOSIS — F1910 Other psychoactive substance abuse, uncomplicated: Secondary | ICD-10-CM

## 2021-04-02 DIAGNOSIS — F909 Attention-deficit hyperactivity disorder, unspecified type: Secondary | ICD-10-CM | POA: Diagnosis not present

## 2021-04-02 DIAGNOSIS — Z638 Other specified problems related to primary support group: Secondary | ICD-10-CM

## 2021-04-02 NOTE — Progress Notes (Signed)
Psychotherapy Progress Note [facility] Marliss Czar, PhD LP  Patient ID: Tracy Robinson     MRN: [id number] Therapy format: Individual psychotherapy Date: 04/02/2021      Start: 9:11a     Stop: 9:58a     Time Spent: 47 min Location: In-person   Session narrative (presenting needs, interim history, self-report of stressors and symptoms, applications of prior therapy, status changes, and interventions made in session) Took opportunity while at beach to beg off so much attention to niece Tracy Robinson.  Mother continues compulsive about taking on responsibility, though she sincerely does not want to tax Tracy Robinson bu creating overloads for herself.  Noted her use of "we" as in "We need to..."  Acknowledged that mother is automatically reinforced by his compliance for suggesting and recruiting responsibility-taking from him, and any deliberate boundary keeping, while tense, will help change her behavior.  Key is not to bottle up, risk blowing, use to control the frustration, contribute to any mutually anxious-making cycle.  Finding it unreal, and depressing, now to be broken up [name] and no recourse to recruit her back or at least get an indication that they can treat this as a considerate pause instead of an ending.  She has shown herself guarded now against any talking about their relationship and prospects, while still professing hope of friendship.  Acknowledged that unburdening himself of old guilt about being unfaithful early in their relationship may have sealed it, not because she is angry about it, but because it was an unconscious act of coercion making her hear it.  2 goals for next week -- would like to write down how many drinks he has, since it is now scaring him how he is escalating, and practice consenting to the end of the day in order to sleep better.  Recommend get acquainted with PMR for sleep readiness and we can tune it up.    Therapeutic modalities: Cognitive Behavioral  Therapy and Solution-Oriented/Positive Psychology  Mental Status/Observations:  Appearance:   Casual     Behavior:  Appropriate  Motor:  Normal  Speech/Language:   Clear and Coherent  Affect:  Appropriate  Mood:  dysthymic  Thought process:  normal  Thought content:    WNL  Sensory/Perceptual disturbances:    WNL  Orientation:  Fully oriented  Attention:  Good    Concentration:  Good  Memory:  WNL  Insight:    Fair  Judgment:   Good  Impulse Control:  Good   Risk Assessment: Danger to Self: No Self-injurious Behavior: No Danger to Others: No Physical Aggression / Violence: No Duty to Warn: No Access to Firearms a concern: No  Assessment of progress:  progressing  Diagnosis:   ICD-10-CM   1. Moderate episode of recurrent major depressive disorder (HCC)  F33.1     2. Generalized anxiety disorder  F41.1     3. Attention deficit hyperactivity disorder (ADHD), unspecified ADHD type  F90.9     4. Polysubstance abuse (HCC) -- alcohol, marijuana  F19.10     5. Relationship problem with family members  Z63.8     6. Sleep disturbances  G47.9      Plan:  Work on accepting [employer] choice without catastrophizing Get acquainted with PMR and see if it helps drop tension for bedtime Resolved to make more of a practice of consenting to the end of the day Resolved to log drinking, accurately and faithfully, to equip better self-control Other recommendations/advice as may be noted above Continue  to utilize previously learned skills ad lib Maintain medication as prescribed and work faithfully with relevant prescriber(s) if any changes are desired or seem indicated Call the clinic on-call service, 988/hotline, present to ER, or call 911 if any life-threatening psychiatric crisis Return q 1-2 wks, for recommend scheduling ahead. Already scheduled visit in this office 04/09/2021.  [name], PhD Marliss Czar, PhD LP Clinical Psychologist, [facility] [address], [address] [place], [place] [address] [phone]

## 2021-04-03 NOTE — Progress Notes (Signed)
[name], licausi are scheduled for a virtual visit with your provider today.    Just as we do with appointments in the office, we must obtain your consent to participate.  Your consent will be active for this visit and any virtual visit you may have with one of our providers in the next 365 days.    If you have a MyChart account, I can also send a copy of this consent to you electronically.  All virtual visits are billed to your insurance company just like a traditional visit in the office.  As this is a virtual visit, video technology does not allow for your provider to perform a traditional examination.  This may limit your provider's ability to fully assess your condition.  If your provider identifies any concerns that need to be evaluated in person or the need to arrange testing such as labs, EKG, etc, we will make arrangements to do so.    Although advances in technology are sophisticated, we cannot ensure that it will always work on either your end or our end.  If the connection with a video visit is poor, we may have to switch to a telephone visit.  With either a video or telephone visit, we are not always able to ensure that we have a secure connection.   I need to obtain your verbal consent now.   Are you willing to proceed with your visit today?   Tracy Robinson has provided verbal consent on 03/27/2021 for a virtual visit (video or telephone).   [name], PhD 03/28/11  9:40am

## 2021-04-09 ENCOUNTER — Other Ambulatory Visit: Payer: Self-pay

## 2021-04-09 ENCOUNTER — Ambulatory Visit (INDEPENDENT_AMBULATORY_CARE_PROVIDER_SITE_OTHER): Payer: 59 | Admitting: Psychiatry

## 2021-04-09 DIAGNOSIS — F1910 Other psychoactive substance abuse, uncomplicated: Secondary | ICD-10-CM | POA: Diagnosis not present

## 2021-04-09 DIAGNOSIS — F331 Major depressive disorder, recurrent, moderate: Secondary | ICD-10-CM | POA: Diagnosis not present

## 2021-04-09 DIAGNOSIS — Z630 Problems in relationship with spouse or partner: Secondary | ICD-10-CM

## 2021-04-09 DIAGNOSIS — F411 Generalized anxiety disorder: Secondary | ICD-10-CM | POA: Diagnosis not present

## 2021-04-09 DIAGNOSIS — F909 Attention-deficit hyperactivity disorder, unspecified type: Secondary | ICD-10-CM | POA: Diagnosis not present

## 2021-04-09 DIAGNOSIS — G479 Sleep disorder, unspecified: Secondary | ICD-10-CM

## 2021-04-09 NOTE — Progress Notes (Signed)
Psychotherapy Progress Note [facility] Marliss Czar, PhD LP  Patient ID: Tracy Robinson     MRN: [id number] Therapy format: Individual psychotherapy Date: 04/09/2021      Start: 10:17a     Stop: 11:04a     Time Spent: 47 min Location: In-person   Session narrative (presenting needs, interim history, self-report of stressors and symptoms, applications of prior therapy, status changes, and interventions made in session) Sleep started out well, went bad.  Drinking kept up through the week.  Did log both drinking and smoking pot, about 3 drinks per evening, and 2-6 doses THC.  Ran out of trazodone Thursday, up till 5:45a and ate a lot.  Weekend not asleep until middle of the night.  Sees escalated drug use match degraded sleep.  Differentially affirmed choosing to go down or stay up, as consent to sleep was the important principle, and not problems with staying up to pet the cat, e.g..  Affirmed choice and weighing consequences.    Brainstormed alternatives to drink/drugs before sleep -- pet Stump, stretching and foam roller, podcast listening (talk/comedy), brain dump for intrusive thoughts, can add muscle relaxation.  Taught PMR in session, with noticeable results.  Resolved to practice for sleep readiness and tension break during the day at discretion.    Has a DBT workbook at home, recommended for his IOP experience.  Advised dig it out and review.  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    Therapeutic modalities: Cognitive Behavioral Therapy and Solution-Oriented/Positive Psychology  Mental Status/Observations:  Appearance:   Casual     Behavior:  Appropriate  Motor:  Normal  Speech/Language:   Clear and Coherent  Affect:  Appropriate  Mood:  angry and dysthymic  Thought process:  normal  Thought content:    WNL and worry  Sensory/Perceptual disturbances:    WNL  Orientation:  Fully oriented  Attention:  Good    Concentration:  Good  Memory:   WNL  Insight:    Good  Judgment:   Good  Impulse Control:  Fair   Risk Assessment: Danger to Self: No Self-injurious Behavior: No Danger to Others: No Physical Aggression / Violence: No Duty to Warn: No Access to Firearms a concern: No  Assessment of progress:  progressing  Diagnosis:   ICD-10-CM   1. Moderate episode of recurrent major depressive disorder (HCC)  F33.1     2. Generalized anxiety disorder  F41.1     3. Attention deficit hyperactivity disorder (ADHD), unspecified ADHD type  F90.9     4. Polysubstance abuse (HCC) -- alcohol, marijuana  F19.10     5. Sleep disturbances  G47.9     6. Relationship problem between partners  Z63.0      Plan:  Continue to log substance use but also anything else tried before using.  Actively engage something from his alternatives list before making any decision to use. Find/review DBT skills workbook to see what is currently useful Continue with consent to sleep, use of any available relaxation techniques including PMR Text or voice message to [name] just to let her know he has looked further at car transfer, it's not that involved, ready to work through it when she is.  No need to go into anything else, just let her have the nonconfrontational time of it she wants. Other recommendations/advice as may be noted above Continue to utilize previously learned skills ad lib Maintain medication as prescribed and work faithfully with relevant prescriber(s) if any  changes are desired or seem indicated Call the clinic on-call service, 988/hotline, present to ER, or call 911 if any life-threatening psychiatric crisis Return for session(s) already scheduled. Already scheduled visit in this office 04/19/2021.  [name], PhD Marliss Czar, PhD LP Clinical Psychologist, [facility] [address], [address] [place], [place] [address] [phone]

## 2021-04-19 ENCOUNTER — Ambulatory Visit (INDEPENDENT_AMBULATORY_CARE_PROVIDER_SITE_OTHER): Payer: 59 | Admitting: Psychiatry

## 2021-04-19 ENCOUNTER — Other Ambulatory Visit: Payer: Self-pay

## 2021-04-19 DIAGNOSIS — F411 Generalized anxiety disorder: Secondary | ICD-10-CM | POA: Diagnosis not present

## 2021-04-19 DIAGNOSIS — Z630 Problems in relationship with spouse or partner: Secondary | ICD-10-CM

## 2021-04-19 DIAGNOSIS — F909 Attention-deficit hyperactivity disorder, unspecified type: Secondary | ICD-10-CM

## 2021-04-19 DIAGNOSIS — F1910 Other psychoactive substance abuse, uncomplicated: Secondary | ICD-10-CM | POA: Diagnosis not present

## 2021-04-19 DIAGNOSIS — F331 Major depressive disorder, recurrent, moderate: Secondary | ICD-10-CM

## 2021-04-19 DIAGNOSIS — G479 Sleep disorder, unspecified: Secondary | ICD-10-CM

## 2021-04-19 NOTE — Progress Notes (Signed)
Psychotherapy Progress Note [facility] Marliss Czar, PhD LP  Patient ID: Tracy Robinson [facility])    MRN: [id number] Therapy format: Individual psychotherapy Date: 04/19/2021      Start: c. 3:10p     Stop: c. 4:00p     Time Spent: 50 min Location: In-person   Session narrative (presenting needs, interim history, self-report of stressors and symptoms, applications of prior therapy, status changes, and interventions made in session) Original note deleted before filing.  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  Noted a few weeks unscheduled and psychiatry visit before next available time.  Encouraged apply relaxation skill and any DBT skills recovered.  Therapeutic modalities: Cognitive Behavioral Therapy and Solution-Oriented/Positive Psychology  Mental Status/Observations:  Appearance:   Casual     Behavior:  Appropriate  Motor:  Normal  Speech/Language:   Clear and Coherent  Affect:  Appropriate  Mood:  depressed  Thought process:  normal  Thought content:    worries  Sensory/Perceptual disturbances:    WNL  Orientation:  Fully oriented  Attention:  Good    Concentration:  Good  Memory:  WNL  Insight:    Good  Judgment:   Good  Impulse Control:  Fair   Risk Assessment: Danger to Self: No Self-injurious Behavior: No Danger to Others: No Physical Aggression / Violence: No Duty to Warn: No Access to Firearms a concern: No  Assessment of progress:  progressing  Diagnosis:   ICD-10-CM   1. Moderate episode of recurrent major depressive disorder (HCC)  F33.1     2. Generalized anxiety disorder  F41.1     3. Attention deficit hyperactivity disorder (ADHD), unspecified ADHD type  F90.9      4. Polysubstance abuse (HCC) -- alcohol, marijuana  F19.10     5. Sleep disturbances  G47.9     6. 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 Already scheduled visit in this office 05/08/2021.  [name], PhD Marliss Czar, PhD LP Clinical Psychologist, [facility] [address] [place], [place] [address] [phone]

## 2021-05-03 ENCOUNTER — Other Ambulatory Visit (HOSPITAL_COMMUNITY): Payer: Self-pay | Admitting: Physician Assistant

## 2021-05-03 ENCOUNTER — Telehealth (HOSPITAL_COMMUNITY): Payer: Self-pay | Admitting: *Deleted

## 2021-05-03 DIAGNOSIS — F909 Attention-deficit hyperactivity disorder, unspecified type: Secondary | ICD-10-CM

## 2021-05-03 MED ORDER — AMPHETAMINE-DEXTROAMPHET ER 30 MG PO CP24: 30.0000 mg | ORAL_CAPSULE | Freq: Every day | ORAL | 0 refills | Status: DC

## 2021-05-03 NOTE — Telephone Encounter (Signed)
VM from [place] requesting a new rx for her Adderall. He has a future appt with provider here on 10/25. He should have been out of his Adderall last month. Will ask Eddie PA to provide a new rx.

## 2021-05-03 NOTE — Progress Notes (Signed)
Provider was contacted by Orpah Clinton. Reola Calkins, RN regarding medication refill for patient's Adderall. Patient's medication to be e-prescribed to pharmacy of choice.

## 2021-05-03 NOTE — Telephone Encounter (Signed)
Provider was contacted by Suzanne K. Beck, RN regarding medication refill for patient's Adderall. Patient's medication to be e-prescribed to pharmacy of choice. 

## 2021-05-08 ENCOUNTER — Other Ambulatory Visit: Payer: Self-pay

## 2021-05-08 ENCOUNTER — Ambulatory Visit (INDEPENDENT_AMBULATORY_CARE_PROVIDER_SITE_OTHER): Payer: 59 | Admitting: Psychiatry

## 2021-05-08 DIAGNOSIS — G479 Sleep disorder, unspecified: Secondary | ICD-10-CM

## 2021-05-08 DIAGNOSIS — F1910 Other psychoactive substance abuse, uncomplicated: Secondary | ICD-10-CM

## 2021-05-08 DIAGNOSIS — F331 Major depressive disorder, recurrent, moderate: Secondary | ICD-10-CM | POA: Diagnosis not present

## 2021-05-08 DIAGNOSIS — F411 Generalized anxiety disorder: Secondary | ICD-10-CM

## 2021-05-08 NOTE — Progress Notes (Signed)
Psychotherapy Progress Note [facility] Marliss Czar, PhD LP  Patient ID: Landynn Dupler [facility])    MRN: [id number] Therapy format: Individual psychotherapy Date: 05/08/2021      Start: 2:04p     Stop: 2:53p     Time Spent: 49 min Location: In-person   Session narrative (presenting needs, interim history, self-report of stressors and symptoms, applications of prior therapy, status changes, and interventions made in session) 3 weeks since last seen, been working on alternative behaviors to drinking/using.  Some application of relaxation skill and recovered DBT, but overall more down, misses [name], some ruminating whether to contact her.  Unshaven several days.  Decided best leave her be as she asked.    Today most wants to reveal sexual issues.  Introduces that he is bisexual, actually, and that longterm cohabitation with [name] has been grounded more in caretaking and long-reaching friendship than deep compatibility.  Examined his attachment, motivations.  Therapeutic modalities: Cognitive Behavioral Therapy, Solution-Oriented/Positive Psychology, and Ego-Supportive  Mental Status/Observations:  Appearance:   Casual , unshaven  Behavior:  Appropriate  Motor:  Normal  Speech/Language:   Clear and Coherent  Affect:  More subdued  Mood:  depressed  Thought process:  normal  Thought content:    WNL  Sensory/Perceptual disturbances:    WNL  Orientation:  Fully oriented  Attention:  Good    Concentration:  Fair  Memory:  WNL  Insight:    Good  Judgment:   Good  Impulse Control:  Fair   Risk Assessment: Danger to Self: No Self-injurious Behavior: No Danger to Others: No Physical Aggression / Violence: No Duty to Warn: No Access to Firearms a concern: No  Assessment of progress:  progressing  Diagnosis:   ICD-10-CM   1. Moderate episode of recurrent major depressive disorder (HCC)  F33.1     2. Generalized anxiety disorder  F41.1     3. Polysubstance abuse  (HCC) -- alcohol, marijuana  F19.10     4. Sleep disturbances  G47.9      Plan:  Self-monitor motives attempting to talk with [name] Go ahead and clean up, it'll feel better De-prioritize working dissertation right now unless it serves well as a change of focus Cont recommendation consider AA for camaraderie and help taming/quitting alcohol Assess peripheral pain when able -- seems to be related to spinal tension Self-affirm  Other recommendations/advice as may be noted above Continue to utilize previously learned skills ad lib Maintain medication as prescribed and work faithfully with relevant prescriber(s) if any changes are desired or seem indicated Call the clinic on-call service, 988/hotline, present to ER, or call 911 if any life-threatening psychiatric crisis Return for session(s) already scheduled. Already scheduled visit in this office 05/14/2021.  [name], PhD Marliss Czar, PhD LP Clinical Psychologist, [facility] [address] [place], [place] [address] [phone]

## 2021-05-14 ENCOUNTER — Other Ambulatory Visit: Payer: Self-pay

## 2021-05-14 ENCOUNTER — Ambulatory Visit (INDEPENDENT_AMBULATORY_CARE_PROVIDER_SITE_OTHER): Payer: 59 | Admitting: Psychiatry

## 2021-05-14 DIAGNOSIS — F1910 Other psychoactive substance abuse, uncomplicated: Secondary | ICD-10-CM

## 2021-05-14 DIAGNOSIS — F411 Generalized anxiety disorder: Secondary | ICD-10-CM | POA: Diagnosis not present

## 2021-05-14 DIAGNOSIS — F909 Attention-deficit hyperactivity disorder, unspecified type: Secondary | ICD-10-CM

## 2021-05-14 DIAGNOSIS — F331 Major depressive disorder, recurrent, moderate: Secondary | ICD-10-CM

## 2021-05-14 DIAGNOSIS — G479 Sleep disorder, unspecified: Secondary | ICD-10-CM | POA: Diagnosis not present

## 2021-05-14 DIAGNOSIS — M549 Dorsalgia, unspecified: Secondary | ICD-10-CM

## 2021-05-14 DIAGNOSIS — Z630 Problems in relationship with spouse or partner: Secondary | ICD-10-CM

## 2021-05-14 NOTE — Progress Notes (Signed)
Psychotherapy Progress Note [facility] Tracy Czar, PhD LP  Patient ID: Tracy Robinson [facility])    MRN: [id number] Therapy format: Individual psychotherapy Date: 05/14/2021      Start: 11:08a     Stop: 11:56a     Time Spent: 48 min Location: In-person   Session narrative (presenting needs, interim history, self-report of stressors and symptoms, applications of prior therapy, status changes, and interventions made in session) Better mood since revealing sexual issues last week.  Shaved today.  5 days no alcohol, feels some better.  1 evening of anxiety, wound up having 4 mixed drinks in 2 hrs.  Clearer that he may just want to quit altogether.  Likes whiskey, but clearly doesn't want to take his father's path to addiction, shortened life, dying ill.  More strongly considering going to AA.  Discussed approach, what to look for, what to do if put off in any way.  Feeling that his substance abuse issues probably exacerbated problems [name], largely for being missing with anxiety, missing with depressed mood, and missing with intoxication.  Still wishes he were with her, feels he became who he is in the context of their long relationship.  Noted impulses to see if he can get a low-impact conversation with her in hopes of making the next one more acceptable, and the next, and the next, until they're back together.  Realizes this sounds, an will act, dependent, and backfire if he does.  Affirmed that if he's gaming for future, he's not ready to talk yet.  Challenged to learn and trust that a future that is truly good for him may or may not involve her, and it has to be full choice on her part, not to mention being actually ready.  Had clean blood work about his peripheral pain.  Is involved with rheumatology, may get an allergy panel.  Father and sister have multiple allergies themselves.   Discussed positive approaches to intolerance of uncertainty -- finding curiosity, engaging  imagination for what could go right, not just what could go wrong, and permission to learn from how things do go, not demand to have it right and prevent all problems.  Therapeutic modalities: Cognitive Behavioral Therapy and Solution-Oriented/Positive Psychology  Mental Status/Observations:  Appearance:   Casual     Behavior:  Appropriate  Motor:  Normal  Speech/Language:   Clear and Coherent  Affect:  Appropriate  Mood:  depressed  Thought process:  normal  Thought content:    WNL  Sensory/Perceptual disturbances:    WNL  Orientation:  Fully oriented  Attention:  Good    Concentration:  Fair  Memory:  WNL  Insight:    Good  Judgment:   Good  Impulse Control:  Good   Risk Assessment: Danger to Self: No Self-injurious Behavior: No Danger to Others: No Physical Aggression / Violence: No Duty to Warn: No Access to Firearms a concern: No  Assessment of progress:  progressing  Diagnosis:   ICD-10-CM   1. Moderate episode of recurrent major depressive disorder (HCC)  F33.1     2. Generalized anxiety disorder  F41.1     3. Polysubstance abuse (HCC) -- alcohol, marijuana  F19.10     4. Sleep disturbances  G47.9     5. Attention deficit hyperactivity disorder (ADHD), unspecified ADHD type  F90.9     6. Relationship problem between partners  Z63.0     7. Back pain, unspecified back location, unspecified back pain laterality, unspecified chronicity  M54.9      Plan:  Self-monitor motives attempting to talk [name] Follow through pain/allergy assessment Cont recommendation consider AA for camaraderie and help taming/quitting alcohol Other recommendations/advice as may be noted above Continue to utilize previously learned skills ad lib Maintain medication as prescribed and work faithfully with relevant prescriber(s) if any changes are desired or seem indicated Call the clinic on-call service, 988/hotline, present to ER, or call 911 if any life-threatening psychiatric  crisis Return for session(s) already scheduled. Already scheduled visit in this office 05/21/2021.  [name], PhD Tracy Czar, PhD LP Clinical Psychologist, [facility] [address], [address] [place], [place] [address] [phone]

## 2021-05-15 ENCOUNTER — Telehealth (HOSPITAL_COMMUNITY): Payer: 59 | Admitting: Family

## 2021-05-15 ENCOUNTER — Telehealth (HOSPITAL_COMMUNITY): Payer: 59 | Admitting: Physician Assistant

## 2021-05-18 ENCOUNTER — Encounter (HOSPITAL_COMMUNITY): Payer: Self-pay | Admitting: Family

## 2021-05-18 ENCOUNTER — Telehealth (INDEPENDENT_AMBULATORY_CARE_PROVIDER_SITE_OTHER): Payer: 59 | Admitting: Family

## 2021-05-18 DIAGNOSIS — F411 Generalized anxiety disorder: Secondary | ICD-10-CM

## 2021-05-18 DIAGNOSIS — F331 Major depressive disorder, recurrent, moderate: Secondary | ICD-10-CM | POA: Diagnosis not present

## 2021-05-18 DIAGNOSIS — G479 Sleep disorder, unspecified: Secondary | ICD-10-CM

## 2021-05-18 DIAGNOSIS — F32A Depression, unspecified: Secondary | ICD-10-CM | POA: Diagnosis not present

## 2021-05-18 DIAGNOSIS — Z638 Other specified problems related to primary support group: Secondary | ICD-10-CM | POA: Diagnosis not present

## 2021-05-18 MED ORDER — SERTRALINE HCL 100 MG PO TABS: 200.0000 mg | ORAL_TABLET | Freq: Every day | ORAL | 2 refills | Status: DC

## 2021-05-18 MED ORDER — TRAZODONE HCL 100 MG PO TABS: 100.0000 mg | ORAL_TABLET | Freq: Every day | ORAL | 1 refills | Status: DC

## 2021-05-18 NOTE — Progress Notes (Signed)
Virtual Visit via Telephone Note  I connected [name] on 05/18/21 at  8:00 AM EDT by telephone and verified that I am speaking with the correct person using two identifiers.  Location: Patient: home Provider: office   I discussed the limitations, risks, security and privacy concerns of performing an evaluation and management service by telephone and the availability of in person appointments. I also discussed with the patient that there may be a patient responsible charge related to this service. The patient expressed understanding and agreed to proceed.    I discussed the assessment and treatment plan with the patient. The patient was provided an opportunity to ask questions and all were answered. The patient agreed with the plan and demonstrated an understanding of the instructions.   The patient was advised to call back or seek an in-person evaluation if the symptoms worsen or if the condition fails to improve as anticipated.  I provided 15 minutes of non-face-to-face time during this encounter.   Tracy Rack, NP   [facility] MD/PA/NP OP Progress Note  05/18/2021 8:17 AM Tracy Robinson  MRN:  [id number]  Chief Complaint:   Tracy Robinson reports " I am in a weird space"  HPI: Tracy Robinson 30 year old male was seen and evaluated telephonically.  Patient has a charted history with generalized anxiety disorder, major depressive disorder, attention deficit disorder and sleep disturbance.  Patient is currently followed by therapy.  He reports feeling and doing well overall, however states he had a really rough few weeks.  States a recent break-up with a girlfriend.  States he has immersed his self in his studies.  States his anxiety continues to be low.    States his depression had increased due to the recent break-up.  Denied suicidal or homicidal ideations.  Denies auditory or visual hallucinations.  Currently rating his depression 5 out of 10 with 10 being the worst.  States his anxiety is a 3 out  of 10 with 10 being the worst.  Reports he is resting well throughout the night.  Denied illicit drug use or substance abuse.  Reports a good appetite.  Tracy Robinson to follow-up 4 weeks for medication management.  Keep all outpatient follow-up appointments with psychiatry and therapy.  Patient was receptive to plan.  Support, encouragement and  reassurance was provided.   Visit Diagnosis:    ICD-10-CM   1. Depression, unspecified depression type  F32.A     2. Relationship problem with family members  Z63.8     3. Generalized anxiety disorder  F41.1       Past Psychiatric History:   Past Medical History: No past medical history on file. No past surgical history on file.  Family Psychiatric History:   Family History: No family history on file.  Social History:  Social History   Socioeconomic History   Marital status: Single    Spouse name: Not on file   Number of children: Not on file   Years of education: Not on file   Highest education level: Not on file  Occupational History   Not on file  Tobacco Use   Smoking status: Never   Smokeless tobacco: Never  Substance and Sexual Activity   Alcohol use: Not on file   Drug use: Not on file   Sexual activity: Not on file  Other Topics Concern   Not on file  Social History Narrative   Not on file   Social Determinants of Health   Financial Resource Strain: Not on  file  Food Insecurity: Not on file  Transportation Needs: Not on file  Physical Activity: Not on file  Stress: Not on file  Social Connections: Not on file    Allergies: No Known Allergies  Metabolic Disorder Labs: No results found for: HGBA1C, MPG No results found for: PROLACTIN No results found for: CHOL, TRIG, HDL, CHOLHDL, VLDL, LDLCALC No results found for: TSH  Therapeutic Level Labs: No results found for: LITHIUM No results found for: VALPROATE No components found for:  CBMZ  Current Medications: Current Outpatient Medications  Medication  Sig Dispense Refill   amphetamine-dextroamphetamine (ADDERALL XR) 30 MG 24 hr capsule Take 1 capsule (30 mg total) by mouth daily. 30 capsule 0   [START ON 06/02/2021] amphetamine-dextroamphetamine (ADDERALL XR) 30 MG 24 hr capsule Take 1 capsule (30 mg total) by mouth daily. 30 capsule 0   sertraline (ZOLOFT) 100 MG tablet Take 2 tablets (200 mg total) by mouth daily. 60 tablet 2   traZODone (DESYREL) 100 MG tablet Take 1 tablet (100 mg total) by mouth at bedtime. 30 tablet 1   No current facility-administered medications for this visit.     Musculoskeletal:   Psychiatric Specialty Exam: Review of Systems  There were no vitals taken for this visit.There is no height or weight on file to calculate BMI.  General Appearance: NA  Eye Contact:  NA  Speech:  Clear and Coherent  Volume:  Normal  Mood:  Anxious and Depressed  Affect:  Congruent  Thought Process:  Coherent  Orientation:  Full (Time, Place, and Person)  Thought Content: Logical   Suicidal Thoughts:  No  Homicidal Thoughts:  No  Memory:  Immediate;   Fair Recent;   Fair  Judgement:  Fair  Insight:  Fair  Psychomotor Activity:  Normal  Concentration:  Concentration: Good  Recall:  Good  Fund of Knowledge: Good  Language: Fair  Akathisia:  No  Handed:  Left  AIMS (if indicated): not done  Assets:  Communication Skills Desire for Improvement Resilience Social Support  ADL's:  Intact  Cognition: WNL  Sleep:  Good   Screenings: GAD-7    Flowsheet Row Video Visit from 03/20/2021 in [facility] Office Visit from 01/23/2021 in [facility] Office Visit from 11/22/2020 in [facility] Office Visit from 09/22/2020 in [facility]  Total GAD-7 Score 19 18 19 20       PHQ2-9    Flowsheet Row Video Visit from 03/20/2021 in [facility] Office Visit from 01/23/2021 in [facility] Office Visit from 11/22/2020 in [facility] Office Visit from 09/22/2020 in [facility]  PHQ-2 Total Score 6 6 5 5   PHQ-9 Total Score 22 20 19 18       Flowsheet Row Video Visit from 03/20/2021 in [facility] Office Visit from 01/23/2021 in [facility] Office Visit from 11/22/2020 in [facility]  C-SSRS RISK CATEGORY No Risk No Risk No Risk        Assessment and Plan: Tracy Robinson 30 year old male was evaluated telephonically as he reports mood has been stable.  Reports intermittent bouts of depression however has decreased anxiety.  He reports taking and tolerating medications well.  Patient appears to be focused on school/dissertation.  Denying suicidal or homicidal ideations.  Chart reviewed medication refills on 05/03/2021.  Patient to follow-up 4  weeks for Adderall refill.  Major depressive disorder Generalized anxiety disorder Attention deficit disorder  Continue Zoloft 200 mg p.o. daily Continue trazodone 100 mg p.o. nightly Continue Adderall 30 mg p.o. daily-medication was recently refilled on 05/03/2021  Patient to follow-up 4 weeks    Tracy Rack, NP 05/18/2021, 8:17 AM

## 2021-05-21 ENCOUNTER — Ambulatory Visit (INDEPENDENT_AMBULATORY_CARE_PROVIDER_SITE_OTHER): Payer: 59 | Admitting: Psychiatry

## 2021-05-21 ENCOUNTER — Other Ambulatory Visit: Payer: Self-pay

## 2021-05-21 DIAGNOSIS — F331 Major depressive disorder, recurrent, moderate: Secondary | ICD-10-CM | POA: Diagnosis not present

## 2021-05-21 DIAGNOSIS — F411 Generalized anxiety disorder: Secondary | ICD-10-CM

## 2021-05-21 DIAGNOSIS — F909 Attention-deficit hyperactivity disorder, unspecified type: Secondary | ICD-10-CM

## 2021-05-21 DIAGNOSIS — G479 Sleep disorder, unspecified: Secondary | ICD-10-CM | POA: Diagnosis not present

## 2021-05-21 DIAGNOSIS — F1910 Other psychoactive substance abuse, uncomplicated: Secondary | ICD-10-CM | POA: Diagnosis not present

## 2021-05-21 NOTE — Progress Notes (Signed)
Psychotherapy Progress Note [facility] Marliss Czar, PhD LP  Patient ID: Tracy Robinson [facility])    MRN: [id number] Therapy format: Individual psychotherapy Date: 05/21/2021      Start: 10:16a     Stop: 11:06a     Time Spent: 50 min Location: In-person   Session narrative (presenting needs, interim history, self-report of stressors and symptoms, applications of prior therapy, status changes, and interventions made in session) Been journaling some, helps.  Been up late and sleeping late more, caught up needed rest.  Resumed nightly drinking last week, 3 drinks two nights, then a beer each of two nights, some of it in response to aggravating noise with niece.  Brainstormed solutions to noise-based aggravation from [place] -- earplugs, bedside speaker to play brown noise, maybe a towel down for the gap under the door, and various ways to interact with her to redirect, learn to modulate her voice, switch attention.  Problems noted with his mother and sister giving her sugar unrealistically.  Returning to priorities, has pressed through brushing teeth 2/day now for a week or more, though it's still off-putting to him.  Formed plans for tomorrow to dig into a dissertation task, working backwards to address foreseeable obstacles.  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  Already uses trazodone regularly, and agrees still worth it to order orange glasses to help time melatonin.  Therapeutic modalities: Cognitive Behavioral Therapy, Solution-Oriented/Positive Psychology, and Ego-Supportive  Mental Status/Observations:  Appearance:   Casual     Behavior:  Appropriate  Motor:  Normal  Speech/Language:   Clear and  Coherent  Affect:  Appropriate  Mood:  dysthymic  Thought process:  normal  Thought content:    WNL  Sensory/Perceptual disturbances:    WNL  Orientation:  Fully oriented  Attention:  Good    Concentration:  Fair  Memory:  WNL  Insight:    Good  Judgment:   Good  Impulse Control:  Fair   Risk Assessment: Danger to Self: No Self-injurious Behavior: No Danger to Others: No Physical Aggression / Violence: No Duty to Warn: No Access to Firearms a concern: No  Assessment of progress:  stabilized  Diagnosis:   ICD-10-CM   1. Moderate episode of recurrent major depressive disorder (HCC)  F33.1     2. Generalized anxiety disorder  F41.1     3. Sleep disturbances  G47.9     4. Polysubstance abuse (HCC) -- alcohol, marijuana  F19.10     5. Attention deficit hyperactivity disorder (ADHD), unspecified ADHD type  F90.9      Plan:  Restore full dental hygiene Cut back alcohol Order amber lenses to help with sleep readiness, circadian timing Tomorrow attack a reasonable-sized piece of dissertation work as discussed Agricultural consultant measures for when rowdy niece or other distractions Other recommendations/advice as may be noted above Continue to utilize previously learned skills ad lib Maintain medication as prescribed and work faithfully with relevant prescriber(s) if any changes are desired or seem indicated Call the clinic on-call service, 988/hotline, present to ER, or call 911 if any life-threatening psychiatric crisis Return for session(s) already scheduled. Already scheduled visit in this office 05/28/2021.  [name], PhD Marliss Czar, PhD LP Clinical Psychologist, [facility] [address] [place], [place] [address] [phone]

## 2021-05-28 ENCOUNTER — Ambulatory Visit (INDEPENDENT_AMBULATORY_CARE_PROVIDER_SITE_OTHER): Payer: 59 | Admitting: Psychiatry

## 2021-05-28 ENCOUNTER — Other Ambulatory Visit: Payer: Self-pay

## 2021-05-28 DIAGNOSIS — F1910 Other psychoactive substance abuse, uncomplicated: Secondary | ICD-10-CM | POA: Diagnosis not present

## 2021-05-28 DIAGNOSIS — G479 Sleep disorder, unspecified: Secondary | ICD-10-CM

## 2021-05-28 DIAGNOSIS — F411 Generalized anxiety disorder: Secondary | ICD-10-CM

## 2021-05-28 DIAGNOSIS — F909 Attention-deficit hyperactivity disorder, unspecified type: Secondary | ICD-10-CM

## 2021-05-28 DIAGNOSIS — F331 Major depressive disorder, recurrent, moderate: Secondary | ICD-10-CM | POA: Diagnosis not present

## 2021-05-28 NOTE — Progress Notes (Signed)
Psychotherapy Progress Note [facility] Tracy Czar, PhD LP  Patient ID: Tracy Robinson [facility])    MRN: [id number] Therapy format: Individual psychotherapy Date: 05/28/2021      Start: 1:10p     Stop: 2:00p     Time Spent: 50 min Location: In-person   Session narrative (presenting needs, interim history, self-report of stressors and symptoms, applications of prior therapy, status changes, and interventions made in session) Not sure what to address today.  Says he has been stretching wake time again, but is trying to focus more on getting comfortable for bed, regardless of sleep readiness.  Has been trying to apply the practice of task-setting the night before.  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  Seeking ways to break through further resistance.  Discussed historical tendency to make too big a promises on his time, coached in breaking down too-large tasks, generating a "menu" of choices on a given day, saying an authentic "no" when he is absolutely stuck, and a perspective of no promises, only deliveries, rather than self-harass with promises and guilt. .   Been better at self-care this week.  Got chiropractic consultation and doing exercises, taking a walk instead of getting high several times, using breathing, sleeping, showering, eating some fiber, cleaning room.    Noted on Adderall today, and observably more energetic.    Acknowledges hobbies can take over great amounts of time, e.g., trading cards, video games (League of Legends).  The game tends to be something he does with a good friend.  Discussed tactics for tempting time -- commit to one game (20 min), construct contingencies of some reasonable piece of work first, scheduling, and if several "work" choices are activating paralysis then list the unwanted choices before deciding.    Traveling next week to see friend [name] in [place].  Anticipates further abstinence  from substances while there.  Affirmed and encouraged.    Next scheduled appt December.  Believes he will do OK waiting, aware of cancellation list option.  Therapeutic modalities: Cognitive Behavioral Therapy and Solution-Oriented/Positive Psychology  Mental Status/Observations:  Appearance:   Casual     Behavior:  Appropriate  Motor:  Normal  Speech/Language:   Clear and Coherent  Affect:  Appropriate and lightening a bit  Mood:  dysthymic  Thought process:  normal  Thought content:    WNL  Sensory/Perceptual disturbances:    WNL  Orientation:  Fully oriented  Attention:  Good    Concentration:  Fair  Memory:  WNL  Insight:    Good  Judgment:   Good  Impulse Control:  Fair   Risk Assessment: Danger to Self: No Self-injurious Behavior: No Danger to Others: No Physical Aggression / Violence: No Duty to Warn: No Access to Firearms a concern: No  Assessment of progress:  progressing  Diagnosis:   ICD-10-CM   1. Moderate episode of recurrent major depressive disorder (HCC)  F33.1     2. Generalized anxiety disorder  F41.1     3. Sleep disturbances  G47.9     4. Polysubstance abuse (HCC) -- alcohol, marijuana  F19.10     5. Attention deficit hyperactivity disorder (ADHD), unspecified ADHD type  F90.9      Plan:  Continue with reasonable target for sleep and establishing sleep readiness through light control, conscious consent to turn off the day Continue reasonable "bites" of dissertation work, digging in in dedicated fashion for tolerable time or scope of work  Continue healthy measures for spinal pain and nutrition Ani-procrastination measures as noted (list choices, assertively refuse some, schedule game as reward, etc) Other recommendations/advice as may be noted above Continue to utilize previously learned skills ad lib Maintain medication as prescribed and work faithfully with relevant prescriber(s) if any changes are desired or seem indicated Call the clinic  on-call service, 988/hotline, present to ER, or call 911 if any life-threatening psychiatric crisis Return for session(s) already scheduled, option [facility]. Already scheduled visit in this office 06/27/2021.  Tracy Fries, PhD Tracy Czar, PhD LP Clinical Psychologist, [facility] [address] [place], [place] [address] [phone]

## 2021-06-27 ENCOUNTER — Other Ambulatory Visit: Payer: Self-pay

## 2021-06-27 ENCOUNTER — Ambulatory Visit (INDEPENDENT_AMBULATORY_CARE_PROVIDER_SITE_OTHER): Payer: 59 | Admitting: Psychiatry

## 2021-06-27 DIAGNOSIS — F411 Generalized anxiety disorder: Secondary | ICD-10-CM

## 2021-06-27 DIAGNOSIS — G479 Sleep disorder, unspecified: Secondary | ICD-10-CM | POA: Diagnosis not present

## 2021-06-27 DIAGNOSIS — F1910 Other psychoactive substance abuse, uncomplicated: Secondary | ICD-10-CM | POA: Diagnosis not present

## 2021-06-27 DIAGNOSIS — F909 Attention-deficit hyperactivity disorder, unspecified type: Secondary | ICD-10-CM

## 2021-06-27 DIAGNOSIS — Z638 Other specified problems related to primary support group: Secondary | ICD-10-CM

## 2021-06-27 DIAGNOSIS — F331 Major depressive disorder, recurrent, moderate: Secondary | ICD-10-CM | POA: Diagnosis not present

## 2021-06-27 NOTE — Progress Notes (Signed)
Psychotherapy Progress Note [facility] Marliss Czar, PhD LP  Patient ID: Tracy Robinson [facility])    MRN: [id number] Therapy format: Individual psychotherapy Date: 06/27/2021      Start: 8:14a     Stop: 9:00a     Time Spent: 46 min Location: In-person   Session narrative (presenting needs, interim history, self-report of stressors and symptoms, applications of prior therapy, status changes, and interventions made in session) Positive visit to [place] to see friend, kept temperate with alcohol and little smoking.  Relapsed on return, less committed to going sober, more disconnected from motivation.  Reviewed choices -- teetotalling, letting it fly, and social only -- and ramifications/expected results of each.  Finds other things going by the wayside -- let "lamotrigine" (trazodone) lapse (which works well to put him to sleep), and no Adderall since Thanksgiving.  Only used in [place], but was getting night sweats and disturbed sleep.  May want to refigure to a lower dose of Adderall when he can reschedule with psychiatry.  Moving from 150 to 200 Zoloft helped a lot with anxiety.    Is using amber lenses now, and feeling desire for sleep within a few minutes.  Off routine of getting up 7 - 7:15am.  Home environment with noise, clutter, and irritating activity from mother and sister is disruptive to going to sleep.  Encouraged in more truly relaxing before sleep, conscious consent to turn loose of the day, and any noise control he can manage.  Sister had another heart attack scare, chest pain symptoms, and happened at a time when [name] was sequestered with headphones on, couldn't hear mother calling because he has hx training himself to ignore false alarms hearing his voice.  Discussed how hypervigilance has been turned on again for mother making repeated requests and for knowing [name] mismanages her juvenile diabetes (doses insulin, then reads b.s.) and may put herself further from true control.   Validated as frustrating.  Therapeutic modalities: Cognitive Behavioral Therapy and Solution-Oriented/Positive Psychology  Mental Status/Observations:  Appearance:   Casual     Behavior:  Appropriate  Motor:  Normal  Speech/Language:   Clear and Coherent  Affect:  Appropriate  Mood:  depressed  Thought process:  normal  Thought content:    WNL  Sensory/Perceptual disturbances:    WNL  Orientation:  Fully oriented  Attention:  Good    Concentration:  Good  Memory:  WNL  Insight:    Good  Judgment:   Good  Impulse Control:  Fair   Risk Assessment: Danger to Self: No Self-injurious Behavior: No Danger to Others: No Physical Aggression / Violence: No Duty to Warn: No Access to Firearms a concern: No  Assessment of progress:  progressing  Diagnosis:   ICD-10-CM   1. Moderate episode of recurrent major depressive disorder (HCC)  F33.1     2. Polysubstance abuse (HCC) -- alcohol, marijuana  F19.10     3. Sleep disturbances  G47.9     4. Generalized anxiety disorder  F41.1     5. Attention deficit hyperactivity disorder (ADHD), unspecified ADHD type  F90.9     6. Relationship problem with family members  Z63.8      Plan:  Substances -- keep trying to refrain from alcohol and smoking, try any other coping first, option AA Psychiatry -- Titrate Adderall, option Wellbutrin augmentation for daytime activation and motivation.   Self-help for sleep -- option OTC sleep aid, continue amber lenses, try to manage noise, practice conscious consent  to sleep, resume walking daytime Anti-distraction -- reestablish "quiet hours" with mother for family tasks, option to go mobile for work sessions Morning motivation -- self-care "chore" checklist, and option to redirect grouchy feelings (playfully call them over once the simple thing is done) Other recommendations/advice as may be noted above Continue to utilize previously learned skills ad lib Maintain medication as prescribed and work  faithfully with relevant prescriber(s) if any changes are desired or seem indicated Call the clinic on-call service, 988/hotline, 911, or present to [facility] or ER if any life-threatening psychiatric crisis Return for session(s) already scheduled. Already scheduled visit in this office 07/04/2021.  [name], PhD Marliss Czar, PhD LP Clinical Psychologist, [facility] [address], [address] [place], [place] [address] [phone]

## 2021-07-02 ENCOUNTER — Other Ambulatory Visit (HOSPITAL_COMMUNITY): Payer: Self-pay | Admitting: Physician Assistant

## 2021-07-02 DIAGNOSIS — F411 Generalized anxiety disorder: Secondary | ICD-10-CM

## 2021-07-02 DIAGNOSIS — F331 Major depressive disorder, recurrent, moderate: Secondary | ICD-10-CM

## 2021-07-04 ENCOUNTER — Ambulatory Visit (INDEPENDENT_AMBULATORY_CARE_PROVIDER_SITE_OTHER): Payer: 59 | Admitting: Psychiatry

## 2021-07-04 ENCOUNTER — Other Ambulatory Visit: Payer: Self-pay

## 2021-07-04 DIAGNOSIS — F411 Generalized anxiety disorder: Secondary | ICD-10-CM

## 2021-07-04 DIAGNOSIS — F331 Major depressive disorder, recurrent, moderate: Secondary | ICD-10-CM

## 2021-07-04 DIAGNOSIS — F1910 Other psychoactive substance abuse, uncomplicated: Secondary | ICD-10-CM | POA: Diagnosis not present

## 2021-07-04 NOTE — Progress Notes (Unsigned)
Psychotherapy Progress Note [facility] Marliss Czar, PhD LP  Patient ID: Tracy Robinson [facility])    MRN: [id number] Therapy format: Individual psychotherapy Date: 07/04/2021      Start: 10:07a     Stop: ***:***     Time Spent: *** min Location: In-person   Session narrative (presenting needs, interim history, self-report of stressors and symptoms, applications of prior therapy, status changes, and interventions made in session) Realizes he needs to introduce his father issues.  Paternal Aunt Tracy Robinson coming in for holiday visit, knows he wants to open up some if it with her.  Parents split at 48, F ("Tracy Robinson" also) was breadwinner, traveling job largely in [place].  He drank a lot, would get home and then go to the pool hall, smelled of alcohol but would dote on the kids sometimes.  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  Every other weekend visits to father, neighborhood with no other kids, shared a room with sister, sometimes forgot to feed them.  At 12 decided it was just too vacant and dismal, didn't want to go over any more.  Sister Tracy Robinson kept going over, asked Tracy Robinson to com sometimes.  GF d. when freshman, F moved in with his mother, F died 2013/08/08(age 74), after refusing to quit drinking.  In retrospect, did allow F to get in touch, not just shut him out.  2013 went to [place], [place] for [employer] grad school, moved in [name].  Hadn't seen F in a while, notably sick and sallow, restless and difficulty speaking.  Died of cirrhosis, surprisingly quickly in 2 weeks.  Surprised to notice he didn't have backed up feelings to pour out.  Recalls habit of irritated utterances like "Uhhn!!" when Tracy Robinson would say "uh".  Experiences of chip on his shoulder about the world.  Able to see what it may have been like insie his depression and acohoism.    Therapeutic modalities: {AM:23362::"Cognitive Behavioral  Therapy","Solution-Oriented/Positive Psychology"}  Mental Status/Observations:  Appearance:   {PSY:22683}     Behavior:  {PSY:21022743}  Motor:  {PSY:22302}  Speech/Language:   {PSY:22685}  Affect:  {PSY:22687}  Mood:  {PSY:31886}  Thought process:  {PSY:31888}  Thought content:    {PSY:6283889257}  Sensory/Perceptual disturbances:    {PSY:[phone]}  Orientation:  {Psych Orientation:23301::"Fully oriented"}  Attention:  {Good-Fair-Poor ratings:23770::"Good"}    Concentration:  {Good-Fair-Poor ratings:23770::"Good"}  Memory:  {PSY:2726003139}  Insight:    {Good-Fair-Poor ratings:23770::"Good"}  Judgment:   {Good-Fair-Poor ratings:23770::"Good"}  Impulse Control:  {Good-Fair-Poor ratings:23770::"Good"}   Risk Assessment: Danger to Self: {Risk:22599::"No"} Self-injurious Behavior: {Risk:22599::"No"} Danger to Others: {Risk:22599::"No"} Physical Aggression / Violence: {Risk:22599::"No"} Duty to Warn: {AMYesNo:22526::"No"} Access to Firearms a concern: {AMYesNo:22526::"No"}  Assessment of progress:  {Progress:22147::"progressing"}  Diagnosis: No diagnosis found. Plan:  *** Other recommendations/advice as may be noted above Continue to utilize previously learned skills ad lib Maintain medication as prescribed and work faithfully with relevant prescriber(s) if any changes are desired or seem indicated Call the clinic on-call service, 988/hotline, 911, or present to [facility] or ER if any life-threatening psychiatric crisis Return for session(s) already scheduled. Already scheduled visit in this office 07/11/2021.  [name], PhD Marliss Czar, PhD LP Clinical Psychologist, [facility] [address] [place], [place] [address] [phone]

## 2021-07-11 ENCOUNTER — Other Ambulatory Visit: Payer: Self-pay

## 2021-07-11 ENCOUNTER — Ambulatory Visit (INDEPENDENT_AMBULATORY_CARE_PROVIDER_SITE_OTHER): Payer: 59 | Admitting: Psychiatry

## 2021-07-11 DIAGNOSIS — F411 Generalized anxiety disorder: Secondary | ICD-10-CM

## 2021-07-11 DIAGNOSIS — F331 Major depressive disorder, recurrent, moderate: Secondary | ICD-10-CM | POA: Diagnosis not present

## 2021-07-11 DIAGNOSIS — Z638 Other specified problems related to primary support group: Secondary | ICD-10-CM

## 2021-07-11 DIAGNOSIS — F1910 Other psychoactive substance abuse, uncomplicated: Secondary | ICD-10-CM

## 2021-07-11 DIAGNOSIS — F909 Attention-deficit hyperactivity disorder, unspecified type: Secondary | ICD-10-CM | POA: Diagnosis not present

## 2021-07-11 NOTE — Progress Notes (Signed)
Psychotherapy Progress Note [facility] Marliss Czar, PhD LP  Patient ID: Tracy Robinson [facility])    MRN: [id number] Therapy format: Individual psychotherapy Date: 07/11/2021      Start: 10:02a     Stop: 10:50a     Time Spent: 48 min Location: In-person   Session narrative (presenting needs, interim history, self-report of stressors and symptoms, applications of prior therapy, status changes, and interventions made in session) Considering moving back to [place] this spring.  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  Daunted by the prospect of training Stump to be an indoor cat, plus he will need to engage new health care, but does find passion rising to do so.  Probed options for supporting himself.  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  Encouraged check in with advisor for social commitment and factfinding about what he can count on there.  Probed issues in severing.  Mother's BF is annoying, for one, and he needs to get away from the atmosphere he creates.  Needs to get away again from being drawn into family emotional and organizational support and implied responsibility to be the prosthetic adult.  Affirmed and encouraged as fulfilling original hopes in school, in and after relationship with [name], etc.  Been thinking about contacting his father (gestalt therapy), as offered.  Not inclined at this point, feels he has settled issues well enough with him and is free to carve his own path in life.  Challenges lie more in present life, organizing and retaking control of his future and development.  Affirmed and encouraged.  Therapeutic modalities: Cognitive Behavioral Therapy and Solution-Oriented/Positive Psychology  Mental Status/Observations:  Appearance:   Casual     Behavior:  Appropriate  Motor:  Normal  Speech/Language:   Clear and Coherent  Affect:  Appropriate  Mood:  dysthymic  and better energy  Thought process:  normal  Thought content:    WNL  Sensory/Perceptual disturbances:    WNL  Orientation:  Fully oriented  Attention:  Good    Concentration:  Good  Memory:  WNL  Insight:    Good  Judgment:   Good  Impulse Control:  Good   Risk Assessment: Danger to Self: No Self-injurious Behavior: No Danger to Others: No Physical Aggression / Violence: No Duty to Warn: No Access to Firearms a concern: No  Assessment of progress:  progressing  Diagnosis:   ICD-10-CM   1. Moderate episode of recurrent major depressive disorder (HCC)  F33.1     2. Generalized anxiety disorder  F41.1     3. Attention deficit hyperactivity disorder (ADHD), unspecified ADHD type  F90.9     4. Relationship problem with family members  Z63.8     5. Polysubstance abuse (HCC) -- alcohol, marijuana  F19.10      Plan:  Endorse plan to relocate back to [facility] for connection to school and motivation to complete.  Flesh out the plan to live and support self there, broach the subject with advisor for social commitment and to assess prospect of teaching.  Continuing offer gestalt work if needed re father Other recommendations/advice as may be noted above Continue to utilize previously learned skills ad lib Maintain medication as prescribed and work faithfully with relevant prescriber(s) if any changes are desired or seem indicated Call the clinic on-call service, 988/hotline, 911, or present to [facility] or ER if any life-threatening psychiatric crisis No follow-ups  on file. Already scheduled visit in this office 07/18/2021.  [name], PhD Marliss Czar, PhD LP Clinical Psychologist, [facility] [address], [address] [place], [place] [address] [phone]

## 2021-07-18 ENCOUNTER — Ambulatory Visit (INDEPENDENT_AMBULATORY_CARE_PROVIDER_SITE_OTHER): Payer: 59 | Admitting: Psychiatry

## 2021-07-18 ENCOUNTER — Other Ambulatory Visit: Payer: Self-pay

## 2021-07-18 DIAGNOSIS — F331 Major depressive disorder, recurrent, moderate: Secondary | ICD-10-CM | POA: Diagnosis not present

## 2021-07-18 DIAGNOSIS — F909 Attention-deficit hyperactivity disorder, unspecified type: Secondary | ICD-10-CM | POA: Diagnosis not present

## 2021-07-18 DIAGNOSIS — Z638 Other specified problems related to primary support group: Secondary | ICD-10-CM

## 2021-07-18 DIAGNOSIS — F411 Generalized anxiety disorder: Secondary | ICD-10-CM | POA: Diagnosis not present

## 2021-07-18 DIAGNOSIS — F1910 Other psychoactive substance abuse, uncomplicated: Secondary | ICD-10-CM

## 2021-07-18 DIAGNOSIS — G479 Sleep disorder, unspecified: Secondary | ICD-10-CM

## 2021-07-18 DIAGNOSIS — F32A Depression, unspecified: Secondary | ICD-10-CM

## 2021-07-18 NOTE — Progress Notes (Unsigned)
Psychotherapy Progress Note [facility] Marliss Czar, PhD LP  Patient ID: Tracy Robinson [facility])    MRN: [id number] Therapy format: Individual psychotherapy Date: 07/18/2021      Start: 8:16a     Stop: ***:***     Time Spent: *** min Location: In-person   Session narrative (presenting needs, interim history, self-report of stressors and symptoms, applications of prior therapy, status changes, and interventions made in session) Still looking to relocate to [place].  Bothered by memory of having worried he had autism, or narcissim (a la father), and reasons in family to wonder about Bipolar, Schizophrenia.  Assured nothing evident about the more drastic disroders, or OCD, even, though he does tend to worry whether he has something worse.  Has told mother about wish to go back to [place] but not yet his advisor.  More divided now about trying to move for March, feeling like it may be too soon, [place] tendencies to all-or-none thinking and putting off based on feeling conerned whethe he caould sign on.  Suster Christina memory -- had bunk beds at father's and she had spasm in the top bunk while he was on coputer, age 30 and she 8.  Was sure she was dead, seeing galssy eyes, foaming mouth, not breathing, turned out to be diabetic seizure, and Raeden had to call 911 without knowing address, reacted to father resucutating her and prematurely claiming he brought her back.  There were other 911 calls, hx of Christina Type I diabetes and  Lter went through her vivid eating disorder, builmia, then anoreia-purge type,   Lastyearm sister had heart attack, not ong after [place] moved back, compounded by mother   Therapeutic modalities: {AM:23362::"Cognitive Behavioral Therapy","Solution-Oriented/Positive Psychology"}  Mental Status/Observations:  Appearance:   {PSY:22683}     Behavior:  {PSY:21022743}  Motor:  {PSY:22302}  Speech/Language:   {PSY:22685}  Affect:  {PSY:22687}  Mood:   {PSY:31886}  Thought process:  {PSY:31888}  Thought content:    {PSY:240 278 3838}  Sensory/Perceptual disturbances:    {PSY:607 531 0125}  Orientation:  {Psych Orientation:23301::"Fully oriented"}  Attention:  {Good-Fair-Poor ratings:23770::"Good"}    Concentration:  {Good-Fair-Poor ratings:23770::"Good"}  Memory:  {[phone]}  Insight:    {Good-Fair-Poor ratings:23770::"Good"}  Judgment:   {Good-Fair-Poor ratings:23770::"Good"}  Impulse Control:  {Good-Fair-Poor ratings:23770::"Good"}   Risk Assessment: Danger to Self: {Risk:22599::"No"} Self-injurious Behavior: {Risk:22599::"No"} Danger to Others: {Risk:22599::"No"} Physical Aggression / Violence: {Risk:22599::"No"} Duty to Warn: {AMYesNo:22526::"No"} Access to Firearms a concern: {AMYesNo:22526::"No"}  Assessment of progress:  {Progress:22147::"progressing"}  Diagnosis:   ICD-10-CM   1. Generalized anxiety disorder  F41.1     2. Depression, unspecified depression type  F32.A     3. Relationship problem with family members  Z63.8      Plan:  *** Other recommendations/advice as may be noted above Continue to utilize previously learned skills ad lib Maintain medication as prescribed and work faithfully with relevant prescriber(s) if any changes are desired or seem indicated Call the clinic on-call service, 988/hotline, 911, or present to [facility] or ER if any life-threatening psychiatric crisis Return for time as available. Already scheduled visit in this office 08/09/2021.  [name], PhD Marliss Czar, PhD LP Clinical Psychologist, [facility] [address] [place], [place] [address] [phone]

## 2021-07-27 ENCOUNTER — Ambulatory Visit (INDEPENDENT_AMBULATORY_CARE_PROVIDER_SITE_OTHER): Payer: 59 | Admitting: Physician Assistant

## 2021-07-27 ENCOUNTER — Encounter (HOSPITAL_COMMUNITY): Payer: Self-pay | Admitting: Physician Assistant

## 2021-07-27 ENCOUNTER — Other Ambulatory Visit: Payer: Self-pay

## 2021-07-27 DIAGNOSIS — G479 Sleep disorder, unspecified: Secondary | ICD-10-CM | POA: Diagnosis not present

## 2021-07-27 DIAGNOSIS — F411 Generalized anxiety disorder: Secondary | ICD-10-CM

## 2021-07-27 DIAGNOSIS — F909 Attention-deficit hyperactivity disorder, unspecified type: Secondary | ICD-10-CM

## 2021-07-27 DIAGNOSIS — F331 Major depressive disorder, recurrent, moderate: Secondary | ICD-10-CM | POA: Diagnosis not present

## 2021-07-27 MED ORDER — AMPHETAMINE-DEXTROAMPHET ER 30 MG PO CP24: 30.0000 mg | ORAL_CAPSULE | Freq: Every day | ORAL | 0 refills | Status: DC

## 2021-07-27 MED ORDER — SERTRALINE HCL 100 MG PO TABS: 200.0000 mg | ORAL_TABLET | Freq: Every day | ORAL | 2 refills | Status: DC

## 2021-07-27 MED ORDER — TRAZODONE HCL 100 MG PO TABS: 100.0000 mg | ORAL_TABLET | Freq: Every day | ORAL | 1 refills | Status: DC

## 2021-07-27 NOTE — Progress Notes (Signed)
BH MD/PA/NP OP Progress Note  07/27/2021 9:10 AM Tracy Robinson  MRN:  [id number]  Chief Complaint:  Chief Complaint   WALK-IN    HPI:   Tracy Ruskric Robinson is a 31 year old male with a past psychiatric history significant for generalized anxiety disorder, major depressive disorder, sleep disturbances, and attention deficit hyperactivity disorder who [facility] for follow-up and medication management.  Patient is currently being managed on the following medications:  Adderall XR 30 mg 24-hour capsule daily Zoloft 200 mg daily Trazodone 100 mg at bedtime  Patient is continuing to take medications as prescribed. Patient reports that his anxiety has dropped down "precipitously" since taking sertraline.  He reports that he has noticed major changes in the intensity of his depression and anxiety and appreciates taking the max dosage of sertraline.  He rates his current anxiety a 5 out of 10. He has noticed some issues with his sleep which he attributes to his Adderall when taken consistently.  Patient endorses occasional bouts of nausea and restlessness he describes as situational in nature.  The level of restlessness or nausea that he experiences depends on what is going on during the day and seems to be magnified whenever he is pressed for time.  Patient also endorses some lack of motivation.  Patient denies any other major issues regarding his mental health.  He expresses that he is often sick due to having to take care of his sisters child from time to time.  Patient states that he has also been going to a chiropractor for management of his issues with his neck and shoulders.  Patient informed provider that he would also be spending a month on prednisone due to consistent pain in his hand, neck, and feet.  A PHQ-9 screen was performed with the patient scoring an 18.  A GAD-7 screen was also performed with the patient scoring a 19.  Patient is alert and  oriented x4, pleasant, calm, cooperative, and fully engaged in conversation during the encounter.  Patient endorses stable mood.  Patient denies suicidal or homicidal ideations.  He further denies auditory or visual hallucinations and does not appear to be responding to internal/external stimuli.  Patient endorses good sleep and receives on average 7 hours of sleep each night.  Patient endorses fair appetite and eats on average 2 meals per day.  Patient endorses alcohol consumption but notes that he is trying to scale back the frequency in which he drinks.  Patient denies tobacco use.  Patient endorses illicit drug use in the form of marijuana.  Visit Diagnosis:    ICD-10-CM   1. Generalized anxiety disorder  F41.1 sertraline (ZOLOFT) 100 MG tablet    2. Moderate episode of recurrent major depressive disorder (HCC)  F33.1 sertraline (ZOLOFT) 100 MG tablet    3. Sleep disturbances  G47.9 traZODone (DESYREL) 100 MG tablet    4. Attention deficit hyperactivity disorder (ADHD), unspecified ADHD type  F90.9 amphetamine-dextroamphetamine (ADDERALL XR) 30 MG 24 hr capsule    amphetamine-dextroamphetamine (ADDERALL XR) 30 MG 24 hr capsule      Past Psychiatric History:  Major depressive disorder Generalized anxiety disorder ADHD Sleep disturbances  Past Medical History: History reviewed. No pertinent past medical history. History reviewed. No pertinent surgical history.  Family Psychiatric History:  Father (deceased) - Alcoholism Sister - mood disorder. Patient states that his sister has also struggled with anorexia and bulimia  Family History: History reviewed. No pertinent family history.  Social History:  Social  History   Socioeconomic History   Marital status: Single    Spouse name: Not on file   Number of children: Not on file   Years of education: Not on file   Highest education level: Not on file  Occupational History   Not on file  Tobacco Use   Smoking status: Never    Smokeless tobacco: Never  Substance and Sexual Activity   Alcohol use: Not on file   Drug use: Not on file   Sexual activity: Not on file  Other Topics Concern   Not on file  Social History Narrative   Not on file   Social Determinants of Health   Financial Resource Strain: Not on file  Food Insecurity: Not on file  Transportation Needs: Not on file  Physical Activity: Not on file  Stress: Not on file  Social Connections: Not on file    Allergies: No Known Allergies  Metabolic Disorder Labs: No results found for: HGBA1C, MPG No results found for: PROLACTIN No results found for: CHOL, TRIG, HDL, CHOLHDL, VLDL, LDLCALC No results found for: TSH  Therapeutic Level Labs: No results found for: LITHIUM No results found for: VALPROATE No components found for:  CBMZ  Current Medications: Current Outpatient Medications  Medication Sig Dispense Refill   amphetamine-dextroamphetamine (ADDERALL XR) 30 MG 24 hr capsule Take 1 capsule (30 mg total) by mouth daily. 30 capsule 0   [START ON 08/30/2021] amphetamine-dextroamphetamine (ADDERALL XR) 30 MG 24 hr capsule Take 1 capsule (30 mg total) by mouth daily. 30 capsule 0   sertraline (ZOLOFT) 100 MG tablet Take 2 tablets (200 mg total) by mouth daily. 60 tablet 2   traZODone (DESYREL) 100 MG tablet Take 1 tablet (100 mg total) by mouth at bedtime. 30 tablet 1   No current facility-administered medications for this visit.     Musculoskeletal: Strength & Muscle Tone: within normal limits Gait & Station: normal Patient leans: N/A  Psychiatric Specialty Exam: Review of Systems  Psychiatric/Behavioral:  Negative for decreased concentration, dysphoric mood, hallucinations, self-injury, sleep disturbance and suicidal ideas. The patient is nervous/anxious. The patient is not hyperactive.    Blood pressure 135/79, pulse 68, height 5\' 9"  (1.753 m), weight 162 lb (73.5 kg).Body mass index is 23.92 kg/m.  General Appearance: Well Groomed   Eye Contact:  Good  Speech:  Clear and Coherent and Normal Rate  Volume:  Normal  Mood:  Anxious and Depressed  Affect:  Congruent  Thought Process:  Coherent and Descriptions of Associations: Intact  Orientation:  Full (Time, Place, and Person)  Thought Content: WDL   Suicidal Thoughts:  No  Homicidal Thoughts:  No  Memory:  Immediate;   Good Recent;   Good Remote;   Good  Judgement:  Good  Insight:  Good  Psychomotor Activity:  Normal  Concentration:  Concentration: Good and Attention Span: Good  Recall:  Good  Fund of Knowledge: Good  Language: Good  Akathisia:  NA  Handed:  Right  AIMS (if indicated): not done  Assets:  Communication Skills Desire for Improvement Housing Social Support Vocational/Educational  ADL's:  Intact  Cognition: WNL  Sleep:  Good   Screenings: GAD-7    Flowsheet Row Clinical Support from 07/27/2021 in [facility] Video Visit from 03/20/2021 in [facility] Office Visit from 01/23/2021 in [facility] Office Visit from 11/22/2020 in [facility] Office Visit from 09/22/2020 in [facility]  Total GAD-7 Score 19 19 18 19 20       PHQ2-9    Flowsheet Row Clinical Support from 07/27/2021 in [facility] Video Visit from 03/20/2021 in [facility] Office Visit from 01/23/2021 in [facility] Office Visit from 11/22/2020 in [facility] Office Visit from 09/22/2020 in [facility]  PHQ-2 Total Score 4 6 6 5 5   PHQ-9 Total Score 18 22 20 19 18       Flowsheet Row Clinical Support from 07/27/2021 in [facility] Video Visit from 03/20/2021 in [facility] Office Visit from 01/23/2021 in [facility]  C-SSRS RISK  CATEGORY No Risk No Risk No Risk        Assessment and Plan:   Shivaan Tierno is a 31 year old male with a past psychiatric history significant for generalized anxiety disorder, major depressive disorder, sleep disturbances, and attention deficit hyperactivity disorder who presents to [facility] for follow-up and medication management.  Patient denies any major issues or concerns regarding his current medication regimen.  He notes that his medications have been effective in managing his current symptoms.  It does appear that patient has some lingering issues related to his anxiety.  Provider informed patient about buspirone.  Patient would like to hold off on taking buspirone for now due to being placed on prednisone.  Provider to reach out to patient regarding medication options.  Patient's medications to be e-prescribed to pharmacy of choice.  1. Generalized anxiety disorder  - sertraline (ZOLOFT) 100 MG tablet; Take 2 tablets (200 mg total) by mouth daily.  Dispense: 60 tablet; Refill: 2  2. Moderate episode of recurrent major depressive disorder (HCC)  - sertraline (ZOLOFT) 100 MG tablet; Take 2 tablets (200 mg total) by mouth daily.  Dispense: 60 tablet; Refill: 2  3. Sleep disturbances  - traZODone (DESYREL) 100 MG tablet; Take 1 tablet (100 mg total) by mouth at bedtime.  Dispense: 30 tablet; Refill: 1  4. Attention deficit hyperactivity disorder (ADHD), unspecified ADHD type  - amphetamine-dextroamphetamine (ADDERALL XR) 30 MG 24 hr capsule; Take 1 capsule (30 mg total) by mouth daily.  Dispense: 30 capsule; Refill: 0 - amphetamine-dextroamphetamine (ADDERALL XR) 30 MG 24 hr capsule; Take 1 capsule (30 mg total) by mouth daily.  Dispense: 30 capsule; Refill: 0  Patient to follow up in 6 weeks Provider spent a total of 17 minutes with the patient/reviewing patient's chart  Tracy Rusk, PA 07/27/2021, 9:10 AM

## 2021-08-05 ENCOUNTER — Encounter (HOSPITAL_COMMUNITY): Payer: Self-pay | Admitting: Physician Assistant

## 2021-08-08 ENCOUNTER — Ambulatory Visit: Payer: 59 | Admitting: Psychiatry

## 2021-08-09 ENCOUNTER — Other Ambulatory Visit: Payer: Self-pay

## 2021-08-09 ENCOUNTER — Ambulatory Visit (INDEPENDENT_AMBULATORY_CARE_PROVIDER_SITE_OTHER): Payer: 59 | Admitting: Psychiatry

## 2021-08-09 DIAGNOSIS — F411 Generalized anxiety disorder: Secondary | ICD-10-CM | POA: Diagnosis not present

## 2021-08-09 DIAGNOSIS — F331 Major depressive disorder, recurrent, moderate: Secondary | ICD-10-CM | POA: Diagnosis not present

## 2021-08-09 DIAGNOSIS — F909 Attention-deficit hyperactivity disorder, unspecified type: Secondary | ICD-10-CM | POA: Diagnosis not present

## 2021-08-09 DIAGNOSIS — F1910 Other psychoactive substance abuse, uncomplicated: Secondary | ICD-10-CM

## 2021-08-09 NOTE — Progress Notes (Unsigned)
Psychotherapy Progress Note [facility] Marliss Czar, PhD LP  Patient ID: Tracy Robinson [facility])    MRN: [id number] Therapy format: Individual psychotherapy Date: 08/09/2021      Start: 10:13a     Stop: ***:***     Time Spent: *** min Location: In-person   Session narrative (presenting needs, interim history, self-report of stressors and symptoms, applications of prior therapy, status changes, and interventions made in session) Has set the [facility] trip now to meet with advisor, tag along with a colleague, scout options for living.  Knows he can dawdle, so next time will want to focus more on that.  Notes having always felt different from others, historically uncomfortable, in some ways proud now.  Worried in h.s. whether he was a psychopath or a narcissist.  2020 or 21 had himself evaluated for autism.  Practicing the REST algorithm from DBT (relax, evaluate, set intention, take action).    Therapeutic modalities: {AM:23362::"Cognitive Behavioral Therapy","Solution-Oriented/Positive Psychology"}  Mental Status/Observations:  Appearance:   {PSY:22683}     Behavior:  {PSY:21022743}  Motor:  {PSY:22302}  Speech/Language:   {PSY:22685}  Affect:  {PSY:22687}  Mood:  {PSY:31886}  Thought process:  {PSY:31888}  Thought content:    {PSY:571-615-7920}  Sensory/Perceptual disturbances:    {PSY:850-085-8823}  Orientation:  {Psych Orientation:23301::"Fully oriented"}  Attention:  {Good-Fair-Poor ratings:23770::"Good"}    Concentration:  {Good-Fair-Poor ratings:23770::"Good"}  Memory:  {PSY:6418584059}  Insight:    {Good-Fair-Poor ratings:23770::"Good"}  Judgment:   {Good-Fair-Poor ratings:23770::"Good"}  Impulse Control:  {Good-Fair-Poor ratings:23770::"Good"}   Risk Assessment: Danger to Self: {Risk:22599::"No"} Self-injurious Behavior: {Risk:22599::"No"} Danger to Others: {Risk:22599::"No"} Physical Aggression / Violence: {Risk:22599::"No"} Duty to Warn:  {AMYesNo:22526::"No"} Access to Firearms a concern: {AMYesNo:22526::"No"}  Assessment of progress:  {Progress:22147::"progressing"}  Diagnosis:   ICD-10-CM   1. Generalized anxiety disorder  F41.1     2. Moderate episode of recurrent major depressive disorder (HCC)  F33.1     3. Attention deficit hyperactivity disorder (ADHD), unspecified ADHD type  F90.9      Plan:  For hard tasks, seek an "Acknowledge and press on" mindset over a "Suppress and just do it" Other recommendations/advice as may be noted above Continue to utilize previously learned skills ad lib Maintain medication as prescribed and work faithfully with relevant prescriber(s) if any changes are desired or seem indicated Call the clinic on-call service, 988/hotline, 911, or present to [facility] or ER if any life-threatening psychiatric crisis No follow-ups on file. Already scheduled visit in this office 08/14/2021.  [name], PhD Marliss Czar, PhD LP Clinical Psychologist, [facility] [address] [place], [place] [address] [phone]

## 2021-08-14 ENCOUNTER — Other Ambulatory Visit: Payer: Self-pay

## 2021-08-14 ENCOUNTER — Ambulatory Visit (INDEPENDENT_AMBULATORY_CARE_PROVIDER_SITE_OTHER): Payer: 59 | Admitting: Psychiatry

## 2021-08-14 DIAGNOSIS — F411 Generalized anxiety disorder: Secondary | ICD-10-CM

## 2021-08-14 DIAGNOSIS — F331 Major depressive disorder, recurrent, moderate: Secondary | ICD-10-CM

## 2021-08-14 DIAGNOSIS — F909 Attention-deficit hyperactivity disorder, unspecified type: Secondary | ICD-10-CM

## 2021-08-14 DIAGNOSIS — F1910 Other psychoactive substance abuse, uncomplicated: Secondary | ICD-10-CM

## 2021-08-14 NOTE — Progress Notes (Unsigned)
Psychotherapy Progress Note [facility] Marliss Czar, PhD LP  Patient ID: Leeroy Lovings [facility])    MRN: [id number] Therapy format: {Therapy Types:21967::"Individual psychotherapy"} Date: 08/14/2021      Start: ***:***     Stop: ***:***     Time Spent: *** min Location: {SvcLoc:22530::"In-person"}   Session narrative (presenting needs, interim history, self-report of stressors and symptoms, applications of prior therapy, status changes, and interventions made in session) Going to [place] in 8 days, wants to prep.  Has contacted all the faculty intendd at [facility], 10 meetings in 10 days, talks scheduled, confirmed he has $1500 in research funds still available.  Nervous about performing, meeting expectations, showing and feeling nerves again.  Worried he will take bad habits on the trip with him -- staying up, eating at off times, not stretching enough  Discussed options to acknowledge anxiety, keep curious perspective, remember he is the customer not the dish on the menu as he goes and investigates, and taking breaks to loosen muscles that automatically tense up, and practicing the R.E.S.T. algorithm.  Also discussed ways to ground himself vs. Anxiety, including making a cheap anxiety thermometer out of a length of pencil.    Notes he's felt blocked getting in touch with friends about being back in town, some fear of finding out they've left town.  Led to text Vernona Rieger in session to notify her he's coming and pop the question whether they coud hang out.    Therapeutic modalities: {AM:23362::"Cognitive Behavioral Therapy","Solution-Oriented/Positive Psychology"}  Mental Status/Observations:  Appearance:   {PSY:22683}     Behavior:  {PSY:21022743}  Motor:  {PSY:22302}  Speech/Language:   {PSY:22685}  Affect:  {PSY:22687}  Mood:  {PSY:31886}  Thought process:  {PSY:31888}  Thought content:    {[phone]}  Sensory/Perceptual disturbances:    {PSY:9856733154}   Orientation:  {Psych Orientation:23301::"Fully oriented"}  Attention:  {Good-Fair-Poor ratings:23770::"Good"}    Concentration:  {Good-Fair-Poor ratings:23770::"Good"}  Memory:  {PSY:417-828-4790}  Insight:    {Good-Fair-Poor ratings:23770::"Good"}  Judgment:   {Good-Fair-Poor ratings:23770::"Good"}  Impulse Control:  {Good-Fair-Poor ratings:23770::"Good"}   Risk Assessment: Danger to Self: {Risk:22599::"No"} Self-injurious Behavior: {Risk:22599::"No"} Danger to Others: {Risk:22599::"No"} Physical Aggression / Violence: {Risk:22599::"No"} Duty to Warn: {AMYesNo:22526::"No"} Access to Firearms a concern: {AMYesNo:22526::"No"}  Assessment of progress:  {Progress:22147::"progressing"}  Diagnosis: No diagnosis found. Plan:  *** Other recommendations/advice as may be noted above Continue to utilize previously learned skills ad lib Maintain medication as prescribed and work faithfully with relevant prescriber(s) if any changes are desired or seem indicated Call the clinic on-call service, 988/hotline, 911, or present to [facility] or ER if any life-threatening psychiatric crisis Return for session(s) already scheduled. Already scheduled visit in this office 09/05/2021.  [name], PhD Marliss Czar, PhD LP Clinical Psychologist, [facility] [address] [place], [place] [address] [phone]

## 2021-08-17 ENCOUNTER — Telehealth (HOSPITAL_COMMUNITY): Payer: Self-pay | Admitting: *Deleted

## 2021-08-17 NOTE — Telephone Encounter (Signed)
VM from patient stating he is going out of town from Feb 1st- the 11 th and would like to pick his medicine up early. He is on Adderall and cant pick up till 2/9 unless provider gives approval for him to have an early pick up. Provider is out of the office till tues the 31st. I will notify the provider on his return for his decision.

## 2021-08-21 ENCOUNTER — Other Ambulatory Visit (HOSPITAL_COMMUNITY): Payer: Self-pay | Admitting: Physician Assistant

## 2021-08-21 DIAGNOSIS — F32A Depression, unspecified: Secondary | ICD-10-CM

## 2021-08-21 DIAGNOSIS — F909 Attention-deficit hyperactivity disorder, unspecified type: Secondary | ICD-10-CM

## 2021-08-21 DIAGNOSIS — F411 Generalized anxiety disorder: Secondary | ICD-10-CM

## 2021-08-21 DIAGNOSIS — F331 Major depressive disorder, recurrent, moderate: Secondary | ICD-10-CM

## 2021-08-21 MED ORDER — LAMOTRIGINE 100 MG PO TABS: 100.0000 mg | ORAL_TABLET | Freq: Every day | ORAL | 1 refills | Status: DC

## 2021-08-21 MED ORDER — SERTRALINE HCL 100 MG PO TABS: 200.0000 mg | ORAL_TABLET | Freq: Every day | ORAL | 1 refills | Status: DC

## 2021-08-21 MED ORDER — AMPHETAMINE-DEXTROAMPHET ER 15 MG PO CP24: 30.0000 mg | ORAL_CAPSULE | Freq: Every day | ORAL | 0 refills | Status: DC

## 2021-08-21 NOTE — Progress Notes (Signed)
Provider was contacted by Orpah Clinton. Reola Calkins, RN regarding early release for patient's Adderall prescription due to going out of town.  Provider was able to contact patient.  Patient is requesting for early release of his Adderall prescription and also needs refills on his other medications.  Provider contacted patient's pharmacy to request early release of patient's Adderall.  Pharmacy informed provider that they are out of Adderall XR 30 mg formulation.  Pharmacy does have Adderall XR 15 mg formulation available.  Provider to put in order for Adderall XR 15 mg 2 capsules daily.  Patient's other prescriptions to be e-prescribed as well to preferred pharmacy.

## 2021-08-21 NOTE — Telephone Encounter (Signed)
Provider was contacted by Orpah Clinton. Reola Calkins, RN regarding early release for patient's Adderall prescription due to going out of town.  Provider was able to contact patient.  Patient is requesting for early release of his Adderall prescription and also needs refills on his other medications.

## 2021-08-29 ENCOUNTER — Ambulatory Visit: Payer: 59 | Admitting: Psychiatry

## 2021-09-05 ENCOUNTER — Other Ambulatory Visit: Payer: Self-pay

## 2021-09-05 ENCOUNTER — Ambulatory Visit (INDEPENDENT_AMBULATORY_CARE_PROVIDER_SITE_OTHER): Payer: 59 | Admitting: Psychiatry

## 2021-09-05 DIAGNOSIS — F331 Major depressive disorder, recurrent, moderate: Secondary | ICD-10-CM

## 2021-09-05 DIAGNOSIS — F411 Generalized anxiety disorder: Secondary | ICD-10-CM | POA: Diagnosis not present

## 2021-09-05 DIAGNOSIS — F909 Attention-deficit hyperactivity disorder, unspecified type: Secondary | ICD-10-CM | POA: Diagnosis not present

## 2021-09-05 DIAGNOSIS — F1910 Other psychoactive substance abuse, uncomplicated: Secondary | ICD-10-CM | POA: Diagnosis not present

## 2021-09-05 DIAGNOSIS — G479 Sleep disorder, unspecified: Secondary | ICD-10-CM

## 2021-09-05 NOTE — Progress Notes (Unsigned)
Psychotherapy Progress Note [facility] Marliss Czar, PhD LP  Patient ID: Tracy Robinson [facility])    MRN: [id number] Therapy format: Individual psychotherapy Date: 09/05/2021      Start: 10:13a     Stop: ***:***     Time Spent: *** min Location: In-person   Session narrative (presenting needs, interim history, self-report of stressors and symptoms, applications of prior therapy, status changes, and interventions made in session) Good trip to [place] -- unexpectedly poised, felt on his game for interacting, communicating, feeling out his opportunities, and truly interested, abe to concentrate and engage well.  Did notice less endurance than he used to have, maybe 3-4 hrs limit, but successfully reconnected with friends, faculty, and colleagues.  More social than he has been in a long time.  Has mentoring calls set up, including one faculty member who has history and perspective on depression in academia.  Made plan for dissertation finish, target to defend next spring, draft to be available December, and looking to job market this fall.  Good idea with advisor Nadine Counts to more regularly send in smaller batches of work.  Was also successful in being able to be vulnerable about what he's been out for, and even about his sexuality, including being bi and single, and about his father.  Injured right wrist the morning he went to [place], which meant no writing the entire time he was there.  Turned out to be a gift, not overpreparing for his meetings and being unamboguously present.  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  Did forfeit some sleep, caught up on return.  Found it helpful to limit media, let mindful silence happen, and   Reconsidering moving right now.  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  3rd option  between  and [place], to go gypsy and travel    Therapeutic modalities: {AM:23362::"Cognitive Behavioral Therapy","Solution-Oriented/Positive Psychology"}  Mental Status/Observations:  Appearance:   {PSY:22683}     Behavior:  {PSY:21022743}  Motor:  {PSY:22302}  Speech/Language:   {PSY:22685}  Affect:  {PSY:22687}  Mood:  {PSY:31886}  Thought process:  {PSY:31888}  Thought content:    {PSY:423-452-8045}  Sensory/Perceptual disturbances:    {PSY:318-616-2202}  Orientation:  {Psych Orientation:23301::"Fully oriented"}  Attention:  {Good-Fair-Poor ratings:23770::"Good"}    Concentration:  {Good-Fair-Poor ratings:23770::"Good"}  Memory:  {PSY:845 050 1624}  Insight:    {Good-Fair-Poor ratings:23770::"Good"}  Judgment:   {Good-Fair-Poor ratings:23770::"Good"}  Impulse Control:  {Good-Fair-Poor ratings:23770::"Good"}   Risk Assessment: Danger to Self: {Risk:22599::"No"} Self-injurious Behavior: {Risk:22599::"No"} Danger to Others: {Risk:22599::"No"} Physical Aggression / Violence: {Risk:22599::"No"} Duty to Warn: {AMYesNo:22526::"No"} Access to Firearms a concern: {AMYesNo:22526::"No"}  Assessment of progress:  {Progress:22147::"progressing"}  Diagnosis: No diagnosis found. Plan:  *** Other recommendations/advice as may be noted above Continue to utilize previously learned skills ad lib Maintain medication as prescribed and work faithfully with relevant prescriber(s) if any changes are desired or seem indicated Call the clinic on-call service, 988/hotline, 911, or present to [facility] or ER if any life-threatening psychiatric crisis No follow-ups on file. Already scheduled visit in this office 09/12/2021.  [name], PhD Marliss Czar, PhD LP Clinical Psychologist, [facility] [address] [place], [place] [address] [phone]

## 2021-09-12 ENCOUNTER — Ambulatory Visit: Payer: 59 | Admitting: Psychiatry

## 2021-09-13 ENCOUNTER — Encounter (HOSPITAL_COMMUNITY): Payer: Self-pay | Admitting: Physician Assistant

## 2021-09-13 ENCOUNTER — Telehealth (INDEPENDENT_AMBULATORY_CARE_PROVIDER_SITE_OTHER): Payer: 59 | Admitting: Physician Assistant

## 2021-09-13 DIAGNOSIS — F331 Major depressive disorder, recurrent, moderate: Secondary | ICD-10-CM

## 2021-09-13 DIAGNOSIS — G479 Sleep disorder, unspecified: Secondary | ICD-10-CM | POA: Diagnosis not present

## 2021-09-13 DIAGNOSIS — F909 Attention-deficit hyperactivity disorder, unspecified type: Secondary | ICD-10-CM

## 2021-09-13 DIAGNOSIS — F411 Generalized anxiety disorder: Secondary | ICD-10-CM | POA: Diagnosis not present

## 2021-09-13 DIAGNOSIS — T887XXA Unspecified adverse effect of drug or medicament, initial encounter: Secondary | ICD-10-CM

## 2021-09-13 MED ORDER — SERTRALINE HCL 100 MG PO TABS: 200.0000 mg | ORAL_TABLET | Freq: Every day | ORAL | 1 refills | Status: DC

## 2021-09-13 MED ORDER — BUSPIRONE HCL 7.5 MG PO TABS
7.5000 mg | ORAL_TABLET | Freq: Two times a day (BID) | ORAL | 1 refills | Status: DC
Start: 2021-09-13 — End: 2021-11-15

## 2021-09-13 MED ORDER — AMPHETAMINE-DEXTROAMPHET ER 30 MG PO CP24: 30.0000 mg | ORAL_CAPSULE | Freq: Every day | ORAL | 0 refills | Status: DC

## 2021-09-13 MED ORDER — LAMOTRIGINE 100 MG PO TABS: 100.0000 mg | ORAL_TABLET | Freq: Every day | ORAL | 1 refills | Status: DC

## 2021-09-13 MED ORDER — TRAZODONE HCL 100 MG PO TABS: 100.0000 mg | ORAL_TABLET | Freq: Every day | ORAL | 1 refills | Status: DC

## 2021-09-13 MED ORDER — ONDANSETRON HCL 4 MG PO TABS: 4.0000 mg | ORAL_TABLET | Freq: Every day | ORAL | 0 refills | Status: AC | PRN

## 2021-09-13 NOTE — Progress Notes (Addendum)
BH MD/PA/NP OP Progress Note  Virtual Visit via Telephone Note  I connected with Tracy Ruskric Sayed on 09/13/21 at  3:30 PM EST by telephone and verified that I am speaking with the correct person using two identifiers.  Location: Patient: Home Provider: Clinic   I discussed the limitations, risks, security and privacy concerns of performing an evaluation and management service by telephone and the availability of in person appointments. I also discussed with the patient that there may be a patient responsible charge related to this service. The patient expressed understanding and agreed to proceed.  Follow Up Instructions:   I discussed the assessment and treatment plan with the patient. The patient was provided an opportunity to ask questions and all were answered. The patient agreed with the plan and demonstrated an understanding of the instructions.   The patient was advised to call back or seek an in-person evaluation if the symptoms worsen or if the condition fails to improve as anticipated.  I provided 28 minutes of non-face-to-face time during this encounter.  Meta HatchetUchenna E Dontea Corlew, PA   09/13/2021 7:34 PM Tracy Ruskric Bungert  MRN:  [id number]  Chief Complaint:  Chief Complaint  Patient presents with   Follow-up   Medication Management   HPI:   Tracy Robinson is a 31 year old male with a past psychiatric history significant for generalized anxiety disorder, major depressive disorder, sleep disturbances, and attention deficit hyperactivity disorder who [facility] via virtual telephone visit for follow-up and medication management.  Patient is currently being managed on the following medications:  Adderall XR 30 mg 24-hour capsule daily Zoloft 200 mg daily Trazodone 100 mg at bedtime  Patient reports that the past month has been a roller coaster.  He reports that he recently came back from his trip to [place] and felt inspired.  Since returning  from his trip, patient states that he has been gradually coming down from the "high" of the experience.  Patient reports that his anxiety has increased some since returning home and rates his anxiety a 4 or 5 out of 10.  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  Patient reports that his depression has also been affected by his recent return from his trip.  Since returning home, patient states that his tendency to blame himself has resurfaced.  He notes that he is being hard on himself.  Patient also endorses irritability and reports trouble kicking the symptoms.  Patient states that he has recently acquired a book on DBT and is trying to learn coping skills to help manage his depression.  Patient endorses the following depressive symptoms: lack of motivation, decreased energy, and difficulty getting out of bed.  Patient also endorses that he has been experiencing nausea and states that 75% of the symptom is unrelated to his anxiety.  A PHQ-9 screen was performed the patient scoring a 16.  A GAD-7 screen was also performed with the patient scoring a 19.  Patient is alert and oriented x4, calm, cooperative, and fully engaged in conversation during the encounter.  Patient described his mood as middling and rates it a 6 out of 10.  Patient denies suicidal or homicidal ideations.  He further denies auditory or visual hallucinations and does not appear to be responding to internal/external stimuli.  Patient endorses good sleep and receives on average 7-1/2 hours of sleep each night.  Patient endorses good appetite and  eats on average 2-3 meals per day.  Patient endorses alcohol consumption and states that he has roughly 4 drinks a week.  Patient denies tobacco use.  Patient endorses illicit drug use in the form of marijuana.  Visit Diagnosis:    ICD-10-CM   1. Medication side effect  T88.7XXA ondansetron (ZOFRAN) 4 MG  tablet    2. Sleep disturbances  G47.9 traZODone (DESYREL) 100 MG tablet    3. Generalized anxiety disorder  F41.1 sertraline (ZOLOFT) 100 MG tablet    busPIRone (BUSPAR) 7.5 MG tablet    4. Moderate episode of recurrent major depressive disorder (HCC)  F33.1 lamoTRIgine (LAMICTAL) 100 MG tablet    sertraline (ZOLOFT) 100 MG tablet    5. Attention deficit hyperactivity disorder (ADHD), unspecified ADHD type  F90.9 amphetamine-dextroamphetamine (ADDERALL XR) 30 MG 24 hr capsule      Past Psychiatric History:  Major depressive disorder Generalized anxiety disorder ADHD Sleep disturbances  Past Medical History: History reviewed. No pertinent past medical history. History reviewed. No pertinent surgical history.  Family Psychiatric History:  Father (deceased) - Alcoholism Sister - mood disorder. Patient states that his sister has also struggled with anorexia and bulimia  Family History: History reviewed. No pertinent family history.  Social History:  Social History   Socioeconomic History   Marital status: Single    Spouse name: Not on file   Number of children: Not on file   Years of education: Not on file   Highest education level: Not on file  Occupational History   Not on file  Tobacco Use   Smoking status: Never   Smokeless tobacco: Never  Substance and Sexual Activity   Alcohol use: Not on file   Drug use: Not on file   Sexual activity: Not on file  Other Topics Concern   Not on file  Social History Narrative   Not on file   Social Determinants of Health   Financial Resource Strain: Not on file  Food Insecurity: Not on file  Transportation Needs: Not on file  Physical Activity: Not on file  Stress: Not on file  Social Connections: Not on file    Allergies: No Known Allergies  Metabolic Disorder Labs: No results found for: HGBA1C, MPG No results found for: PROLACTIN No results found for: CHOL, TRIG, HDL, CHOLHDL, VLDL, LDLCALC No results found for:  TSH  Therapeutic Level Labs: No results found for: LITHIUM No results found for: VALPROATE No components found for:  CBMZ  Current Medications: Current Outpatient Medications  Medication Sig Dispense Refill   busPIRone (BUSPAR) 7.5 MG tablet Take 1 tablet (7.5 mg total) by mouth 2 (two) times daily. 60 tablet 1   ondansetron (ZOFRAN) 4 MG tablet Take 1 tablet (4 mg total) by mouth daily as needed for nausea or vomiting. 30 tablet 0   [START ON 09/20/2021] amphetamine-dextroamphetamine (ADDERALL XR) 30 MG 24 hr capsule Take 1 capsule (30 mg total) by mouth daily. 30 capsule 0   lamoTRIgine (LAMICTAL) 100 MG tablet Take 1 tablet (100 mg total) by mouth daily. 30 tablet 1   sertraline (ZOLOFT) 100 MG tablet Take 2 tablets (200 mg total) by mouth daily. 60 tablet 1   traZODone (DESYREL) 100 MG tablet Take 1 tablet (100 mg total) by mouth at bedtime. 30 tablet 1   No current facility-administered medications for this visit.     Musculoskeletal: Strength & Muscle Tone: Unable to assess due to telemedicine visit Gait & Station: Unable to assess due to telemedicine  visit Patient leans: Unable to assess due to telemedicine visit  Psychiatric Specialty Exam: Review of Systems  Psychiatric/Behavioral:  Negative for decreased concentration, dysphoric mood, hallucinations, self-injury, sleep disturbance and suicidal ideas. The patient is nervous/anxious. The patient is not hyperactive.    There were no vitals taken for this visit.There is no height or weight on file to calculate BMI.  General Appearance: Unable to assess due to telemedicine visit  Eye Contact:  Unable to assess due to telemedicine visit  Speech:  Clear and Coherent and Normal Rate  Volume:  Normal  Mood:  Anxious and Depressed  Affect:  Congruent  Thought Process:  Coherent, Goal Directed, and Descriptions of Associations: Intact  Orientation:  Full (Time, Place, and Person)  Thought Content: WDL   Suicidal Thoughts:  No   Homicidal Thoughts:  No  Memory:  Immediate;   Good Recent;   Good Remote;   Good  Judgement:  Good  Insight:  Good  Psychomotor Activity:  Normal  Concentration:  Concentration: Good and Attention Span: Good  Recall:  Good  Fund of Knowledge: Good  Language: Good  Akathisia:  No  Handed:  Right  AIMS (if indicated): not done  Assets:  Communication Skills Desire for Improvement Housing Social Support Vocational/Educational  ADL's:  Intact  Cognition: WNL  Sleep:  Good   Screenings: GAD-7    Flowsheet Row Video Visit from 09/13/2021 in [facility] Clinical Support from 07/27/2021 in [facility] Video Visit from 03/20/2021 in [facility] Office Visit from 01/23/2021 in [facility] Office Visit from 11/22/2020 in [facility]  Total GAD-7 Score 19 19 19 18 19       PHQ2-9    Flowsheet Row Video Visit from 09/13/2021 in [facility] Clinical Support from 07/27/2021 in [facility] Video Visit from 03/20/2021 in [facility] Office Visit from 01/23/2021 in [facility] Office Visit from 11/22/2020 in [facility]  PHQ-2 Total Score 4 4 6 6 5   PHQ-9 Total Score 16 18 22 20 19       Flowsheet Row Video Visit from 09/13/2021 in [facility] Clinical Support from 07/27/2021 in [facility] Video Visit from 03/20/2021 in [facility]  C-SSRS RISK CATEGORY No Risk No Risk No Risk        Assessment and Plan:   Weslie Henault is a 31 year old male with a past psychiatric history significant for generalized anxiety disorder, major depressive disorder, sleep disturbances, and attention deficit hyperactivity disorder who presents to [facility] for follow-up and medication management.  Patient reports that he has been experiencing anxiety and depression since returning from his trip to [place].  His current symptoms also can be attributed to stressors in his life.  Patient also endorses nausea not entirely attributed to his anxiety.  The patient was recommended buspirone 7.5 mg 2 times daily for the management of his anxiety.  Patient was also informed that buspirone may be able to augment the effects of his sertraline.  Patient was agreeable to recommendation.  Patient was given a short supply of Zofran in case of side effects experienced from buspirone use.  Patient's medications to be e-prescribed to pharmacy of choice.  Collaboration of Care: Collaboration of Care: Medication Management AEB provider is currently managing  patient's psychiatric medications, Psychiatrist AEB patient is currently being seen by a mental health provider, and Referral or follow-up with counselor/therapist AEB patient being seen by a psychologist [name], PhD)  Patient/Guardian was advised Release of Information must be obtained prior to any record release in order to collaborate their care with an outside provider. Patient/Guardian was advised if they have not already done so to contact the registration department to sign all necessary forms in order for [place] to release information regarding their care.   Consent: Patient/Guardian gives verbal consent for treatment and assignment of benefits for services provided during this visit. Patient/Guardian expressed understanding and agreed to proceed.   1. Sleep disturbances  - traZODone (DESYREL) 100 MG tablet; Take 1 tablet (100 mg total) by mouth at bedtime.  Dispense: 30 tablet; Refill: 1  2. Generalized anxiety disorder  - sertraline (ZOLOFT) 100 MG tablet; Take 2 tablets (200 mg total) by mouth daily.  Dispense: 60 tablet; Refill: 1 - busPIRone (BUSPAR) 7.5 MG  tablet; Take 1 tablet (7.5 mg total) by mouth 2 (two) times daily.  Dispense: 60 tablet; Refill: 1  3. Moderate episode of recurrent major depressive disorder (HCC)  - lamoTRIgine (LAMICTAL) 100 MG tablet; Take 1 tablet (100 mg total) by mouth daily.  Dispense: 30 tablet; Refill: 1 - sertraline (ZOLOFT) 100 MG tablet; Take 2 tablets (200 mg total) by mouth daily.  Dispense: 60 tablet; Refill: 1  4. Attention deficit hyperactivity disorder (ADHD), unspecified ADHD type  - amphetamine-dextroamphetamine (ADDERALL XR) 30 MG 24 hr capsule; Take 1 capsule (30 mg total) by mouth daily.  Dispense: 30 capsule; Refill: 0  5. Medication side effect  - ondansetron (ZOFRAN) 4 MG tablet; Take 1 tablet (4 mg total) by mouth daily as needed for nausea or vomiting.  Dispense: 30 tablet; Refill: 0  Patient to follow up in 2 months Provider spent a total of 28 minutes with the patient/reviewing patient's chart  Meta Hatchet, PA 09/13/2021, 7:34 PM

## 2021-09-19 ENCOUNTER — Ambulatory Visit (INDEPENDENT_AMBULATORY_CARE_PROVIDER_SITE_OTHER): Payer: 59 | Admitting: Psychiatry

## 2021-09-19 ENCOUNTER — Other Ambulatory Visit: Payer: Self-pay

## 2021-09-19 DIAGNOSIS — F909 Attention-deficit hyperactivity disorder, unspecified type: Secondary | ICD-10-CM | POA: Diagnosis not present

## 2021-09-19 DIAGNOSIS — F1910 Other psychoactive substance abuse, uncomplicated: Secondary | ICD-10-CM

## 2021-09-19 DIAGNOSIS — F411 Generalized anxiety disorder: Secondary | ICD-10-CM

## 2021-09-19 DIAGNOSIS — Z630 Problems in relationship with spouse or partner: Secondary | ICD-10-CM

## 2021-09-19 DIAGNOSIS — Z638 Other specified problems related to primary support group: Secondary | ICD-10-CM

## 2021-09-19 DIAGNOSIS — F331 Major depressive disorder, recurrent, moderate: Secondary | ICD-10-CM

## 2021-09-19 NOTE — Progress Notes (Unsigned)
Psychotherapy Progress Note [facility] Marliss Czar, PhD LP  Patient ID: Tracy Robinson [facility])    MRN: [id number] Therapy format: Individual psychotherapy Date: 09/19/2021      Start: ***:***     Stop: ***:***     Time Spent: *** min Location: {SvcLoc:22530::"In-person"}   Session narrative (presenting needs, interim history, self-report of stressors and symptoms, applications of prior therapy, status changes, and interventions made in session) Plan remains to live with mother for a while, possibly go nomadic, before relocating to [place], but now leaning more toward relocating in August.  [place]ing to [place] did [place] him more tired than expected.  Undecided, but successfully working out boundaries at the house with mother.  She remains an anxious person in her own right, so can't help but notice some angst, but feels he and mother have right now a good working understanding.  Could   Has many subjects he'd like to unpack and sort out today.  Still inclined to engage [name], would like to apologize, empathize better with her for how check in how she's doing.  Discussed possibility of writing a letter -- one version for his own expression, one version for actually sending, sensitive to nonverbals and his audience and making sure that the form and message of an apology are not unintentionally coercive.    Professor, Alric Ran, offered Tuesday checkins, but the conversations are stretching out and involving  Positive interest in group therapy now.  Most likely will have to consider it for [facility].    "Alcohol is still a thing", still can feel crappy just for physiological effects if he overinduged.   Feeling like dating again, but apprehensive, partly about coming out to mother about being bisexual.    Therapeutic modalities: {AM:23362::"Cognitive Behavioral Therapy","Solution-Oriented/Positive Psychology"}  Mental Status/Observations:  Appearance:   {PSY:22683}      Behavior:  {PSY:21022743}  Motor:  {PSY:22302}  Speech/Language:   {PSY:22685}  Affect:  {PSY:22687}  Mood:  {PSY:31886}  Thought process:  {PSY:31888}  Thought content:    {PSY:[phone]}  Sensory/Perceptual disturbances:    {[phone]}  Orientation:  {Psych Orientation:23301::"Fully oriented"}  Attention:  {Good-Fair-Poor ratings:23770::"Good"}    Concentration:  {Good-Fair-Poor ratings:23770::"Good"}  Memory:  {PSY:8143576184}  Insight:    {Good-Fair-Poor ratings:23770::"Good"}  Judgment:   {Good-Fair-Poor ratings:23770::"Good"}  Impulse Control:  {Good-Fair-Poor ratings:23770::"Good"}   Risk Assessment: Danger to Self: {Risk:22599::"No"} Self-injurious Behavior: {Risk:22599::"No"} Danger to Others: {Risk:22599::"No"} Physical Aggression / Violence: {Risk:22599::"No"} Duty to Warn: {AMYesNo:22526::"No"} Access to Firearms a concern: {AMYesNo:22526::"No"}  Assessment of progress:  {Progress:22147::"progressing"}  Diagnosis: No diagnosis found. Plan:  *** Other recommendations/advice as may be noted above Continue to utilize previously learned skills ad lib Maintain medication as prescribed and work faithfully with relevant prescriber(s) if any changes are desired or seem indicated Call the clinic on-call service, 988/hotline, 911, or present to [facility] or ER if any life-threatening psychiatric crisis No follow-ups on file. Already scheduled visit in this office 09/26/2021.  [name], PhD Marliss Czar, PhD LP Clinical Psychologist, [facility] [address], [address] [place], [place] [address] [phone]

## 2021-09-26 ENCOUNTER — Other Ambulatory Visit: Payer: Self-pay

## 2021-09-26 ENCOUNTER — Ambulatory Visit (INDEPENDENT_AMBULATORY_CARE_PROVIDER_SITE_OTHER): Payer: 59 | Admitting: Psychiatry

## 2021-09-26 DIAGNOSIS — F411 Generalized anxiety disorder: Secondary | ICD-10-CM

## 2021-09-26 DIAGNOSIS — Z630 Problems in relationship with spouse or partner: Secondary | ICD-10-CM

## 2021-09-26 DIAGNOSIS — F331 Major depressive disorder, recurrent, moderate: Secondary | ICD-10-CM | POA: Diagnosis not present

## 2021-09-26 NOTE — Progress Notes (Unsigned)
Psychotherapy Progress Note [facility] Tracy Moore, PhD LP  Patient ID: Tracy Robinson [facility])    MRN: [id number] Therapy format: Individual psychotherapy Date: 09/26/2021      Start: 9:12a     Stop: 10:02a     Time Spent: 50 min Location: In-person   Session narrative (presenting needs, interim history, self-report of stressors and symptoms, applications of prior therapy, status changes, and interventions made in session) Priority today to talk about Tracy Robinson, the cat.  GM's originaly, essentially mother's, but she doesn't really want the cat.  Pretty much adopted her after the breakup, now feels more bonded .  Tracy Robinson was the one influence that drew him out when depressed, and she has warmed to contact with him when she  Been an outdoor cat, now hoping to convert to indoor, wants to take her with him to [place].  Plus she's a link to F and GM.  Discussed values and apprehensions, resolved he will try.  Re. Tracy Robinson, needs to go into history.  Met in [place], dated college, shared a unique kind of melancholy.  Both had broken up with high school longterms after arriving at college.  She asked him out, he was reluctant, but once started, were on heavy.  Broke up one semester late Junior year, when she felt he was too distant, resumed senior year, both of them seeing someone else in the interim.  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  Tracy Robinson's depression kicked up while away, and the commitment of moving in together, in the shadow of broken marriages in their families, came to fights.  His acceptance at [employer] (prime program for rhetoric), timed well with her wanting to take a break.  Meanwhile, he was not delivering on a promise to propose.  First few years in [place] he was  enjoying school but dreading going home to depressed Tracy Robinson.  Finances, sexual relationship eroded, not much partnership, just surviviing.  Came to try individual and couples therapy.  Benefit of hindsight, he didn't give it enough effort, more or less settled for taking care of business.  Came home to [place] in the face of rent increases and COVID and Tracy Robinson's system reacting poorly.  Confesses having doubts about the relationship himself back to 2018, but talked himself back into commitment  Knows Tracy Robinson's depression   Therapeutic modalities: {AM:23362::"Cognitive Behavioral Therapy","Solution-Oriented/Positive Psychology"}  Mental Status/Observations:  Appearance:   {PSY:22683}     Behavior:  {PSY:21022743}  Motor:  {PSY:22302}  Speech/Language:   {PSY:22685}  Affect:  {PSY:22687}  Mood:  {PSY:31886}  Thought process:  {PSY:31888}  Thought content:    {PSY:586-065-2334}  Sensory/Perceptual disturbances:    {PSY:309-626-8951}  Orientation:  {Psych Orientation:23301::"Fully oriented"}  Attention:  {Good-Fair-Poor ratings:23770::"Good"}    Concentration:  {Good-Fair-Poor ratings:23770::"Good"}  Memory:  {PSY:626 556 8519}  Insight:    {Good-Fair-Poor ratings:23770::"Good"}  Judgment:   {Good-Fair-Poor ratings:23770::"Good"}  Impulse Control:  {Good-Fair-Poor ratings:23770::"Good"}   Risk Assessment: Danger to Self: {Risk:22599::"No"} Self-injurious Behavior: {Risk:22599::"No"} Danger to Others: {Risk:22599::"No"} Physical Aggression / Violence: {Risk:22599::"No"} Duty to Warn: {AMYesNo:22526::"No"} Access to Firearms a concern: {AMYesNo:22526::"No"}  Assessment of progress:  {Progress:22147::"progressing"}  Diagnosis:   ICD-10-CM   1. Moderate episode of recurrent major depressive disorder (HCC)  F33.1  2. Generalized anxiety disorder  F41.1     3. Relationship problem between partners  Z63.0      Plan:  *** Other recommendations/advice as may be noted above Continue to  utilize previously learned skills ad lib Maintain medication as prescribed and work faithfully with relevant prescriber(s) if any changes are desired or seem indicated Call the clinic on-call service, 988/hotline, 911, or present to [facility] or ER if any life-threatening psychiatric crisis Return for session(s) already scheduled. Already scheduled visit in this office 10/03/2021.  Tracy Serve, PhD Tracy Moore, PhD LP Clinical Psychologist, [facility] [address] [place], [place] [address] [phone]

## 2021-10-02 ENCOUNTER — Other Ambulatory Visit: Payer: Self-pay | Admitting: Rheumatology

## 2021-10-02 DIAGNOSIS — M545 Low back pain, unspecified: Secondary | ICD-10-CM

## 2021-10-03 ENCOUNTER — Ambulatory Visit (INDEPENDENT_AMBULATORY_CARE_PROVIDER_SITE_OTHER): Payer: 59 | Admitting: Psychiatry

## 2021-10-03 ENCOUNTER — Other Ambulatory Visit: Payer: Self-pay

## 2021-10-03 DIAGNOSIS — F331 Major depressive disorder, recurrent, moderate: Secondary | ICD-10-CM | POA: Diagnosis not present

## 2021-10-03 DIAGNOSIS — F909 Attention-deficit hyperactivity disorder, unspecified type: Secondary | ICD-10-CM

## 2021-10-03 DIAGNOSIS — F1910 Other psychoactive substance abuse, uncomplicated: Secondary | ICD-10-CM

## 2021-10-03 DIAGNOSIS — F411 Generalized anxiety disorder: Secondary | ICD-10-CM | POA: Diagnosis not present

## 2021-10-03 NOTE — Progress Notes (Signed)
Psychotherapy Progress Note ?[facility] ?Marliss Czar, PhD LP ? ?Patient ID: Tracy Robinson [facility])    MRN: [id number] ?Therapy format: Individual psychotherapy ?Date: 10/03/2021      Start: 9:07a     Stop: 9:57a     Time Spent: 50 min ?Location: In-person  ? ?Session narrative (presenting needs, interim history, self-report of stressors and symptoms, applications of prior therapy, status changes, and interventions made in session) ?Feeling aimless lately, more ambivalent, actually.  Encourage to consider what "favors" Tracy Robinson would like from Tracy Robinson today, and what he can do for that future.  Settled on making  decisive "raid" clearing the his room, on positive grounds (taking care of my environment takes care of me). ? ?Stalled about domesticating Tracy Robinson, in preparation for going together to Tracy Robinson.  Reviewed reasons to want Tracy with him going forward.  Is also allergic to cats, as it turns out, but wants to work with the, is already on an allergy med most of the time.  Resolve again it's OK to be experimental in working on Tracy Robinson, just see if and how it works.  And maybe working his Robinson allergy, which seems to complicate things, but if he feels it's worth it for the companionship, he can work with an Tracy Robinson to try to tame sxs. ? ?New med, on buspirone now.  Tolerating OK unless he misses a meal.  Rheumatology has ruled out major issues, will be following with ENT (sinus pain, swelling; hx of surgery for septum and turbinates) and GI.  Family hx of esophageal stricture.  Suspected GERD, too.  Briefly educated, encouraged. ? ?Is picking up Tracy Robinson again from day care now.  Another responsibility returned to him but soberly accepted. ? ?Therapeutic modalities: Cognitive Behavioral Therapy and Solution-Oriented/Positive Psychology ? ?Mental Status/Observations: ? ?Appearance:   Negative     ?Behavior:  Appropriate  ?Motor:  Normal  ?Speech/Language:   Clear  and Coherent  ?Affect:  Appropriate  ?Mood:  anxious and subdued  ?Thought process:  normal  ?Thought content:    WNL  ?Sensory/Perceptual disturbances:    WNL  ?Orientation:  Fully oriented  ?Attention:  Good  ?  ?Concentration:  Good  ?Memory:  WNL  ?Insight:    Good  ?Judgment:   Good  ?Impulse Control:  Good  ? ?Risk Assessment: ?Danger to Self: No Self-injurious Behavior: No ?Danger to Others: No Physical Aggression / Violence: No ?Duty to Warn: No Access to Firearms a concern: No ? ?Assessment of progress:  progressing ? ?Diagnosis: ?  ICD-10-CM   ?1. Moderate episode of recurrent major depressive disorder (HCC)  F33.1   ?  ?2. Generalized anxiety disorder  F41.1   ?  ?3. Attention deficit hyperactivity disorder (ADHD), unspecified ADHD type  F90.9   ?  ?4. Polysubstance abuse (HCC) -- alcohol, marijuana  F19.10   ?  ? ?Plan:  ?OK to keep working on Optometrist.  Option to see allergist to domesticate Robinson allergy. ?Endorse antianxiety med ?Endorse considering whether to bring down stimulant use/schedule -- self-monitor for anxiety response vs. Clarity and drive he sought stimulant for, and go with whatever is the better balance.  Consult PRN with psychiatry. ?Other recommendations/advice as may be noted above ?Continue to utilize previously learned skills ad lib ?Maintain medication as prescribed and work faithfully with relevant prescriber(s) if any changes are desired or seem indicated ?Call the clinic on-call service, 988/hotline, 911, or present to Tracy Robinson or ER if any life-threatening  psychiatric crisis ?Return for session(s) already scheduled. ?Already scheduled visit in this office 10/10/2021. ? ?[name], PhD ?Marliss Czar, PhD LP ?Clinical Psychologist, [facility] ?[facility] ?[address] ?[place], [place] [address] ?(o) [phone] ?

## 2021-10-10 ENCOUNTER — Ambulatory Visit (INDEPENDENT_AMBULATORY_CARE_PROVIDER_SITE_OTHER): Payer: 59 | Admitting: Psychiatry

## 2021-10-10 ENCOUNTER — Telehealth (HOSPITAL_COMMUNITY): Payer: Self-pay

## 2021-10-10 ENCOUNTER — Other Ambulatory Visit: Payer: Self-pay

## 2021-10-10 DIAGNOSIS — F909 Attention-deficit hyperactivity disorder, unspecified type: Secondary | ICD-10-CM | POA: Diagnosis not present

## 2021-10-10 DIAGNOSIS — F411 Generalized anxiety disorder: Secondary | ICD-10-CM | POA: Diagnosis not present

## 2021-10-10 DIAGNOSIS — F331 Major depressive disorder, recurrent, moderate: Secondary | ICD-10-CM

## 2021-10-10 NOTE — Telephone Encounter (Signed)
error 

## 2021-10-10 NOTE — Progress Notes (Signed)
Psychotherapy Progress Note ?[facility] ?Marliss Czar, PhD LP ? ?Patient ID: Tracy Robinson [facility])    MRN: [id number] ?Therapy format: Individual psychotherapy ?Date: 10/10/2021      Start: 9:15a     Stop: 10:05a     Time Spent: 50 min ?Location: In-person  ? ?Session narrative (presenting needs, interim history, self-report of stressors and symptoms, applications of prior therapy, status changes, and interventions made in session) ?Broached the subject to mother of joining therapy for a time.  Thinks she's interested, but she is traditionally leery of counseling, seems to be a religious bias.  Discussed parameters, expectations, safeties, disclosures, philosophy of including a family guest, and illustrated [place] approach/opening.  Feels sure it will be fine if they do.  ? ?Off Adderall for 4 days or so, due to shortage.  Used smaller dose to ramp down, and not feeling the need for it, really.  Confirmed impression that his distractibility seems as much anxiety-based as anything, so better stress management and self-discipline could be enough without it.  Reconfirmed that it is his option whether to use sanctioned chemistry to enable executive function and organization or lay off it to more fully see how organization and anxiety run off stimulant.   ? ?Therapeutic modalities: Cognitive Behavioral Therapy and Solution-Oriented/Positive Psychology ? ?Mental Status/Observations: ? ?Appearance:   Casual     ?Behavior:  Appropriate  ?Motor:  Normal  ?Speech/Language:   Clear and Coherent  ?Affect:  Appropriate  ?Mood:  Less anxious  ?Thought process:  normal  ?Thought content:    WNL  ?Sensory/Perceptual disturbances:    WNL  ?Orientation:  Fully oriented  ?Attention:  Good  ?  ?Concentration:  Good  ?Memory:  WNL  ?Insight:    Good  ?Judgment:   Good  ?Impulse Control:  Good  ? ?Risk Assessment: ?Danger to Self: No Self-injurious Behavior: No ?Danger to Others: No Physical Aggression / Violence: No ?Duty  to Warn: No Access to Firearms a concern: No ? ?Assessment of progress:  progressing ? ?Diagnosis: ?  ICD-10-CM   ?1. Generalized anxiety disorder  F41.1   ?  ?2. Attention deficit hyperactivity disorder (ADHD), unspecified ADHD type  F90.9   ?  ?3. Moderate episode of recurrent major depressive disorder (HCC)  F33.1   ?  ? ?Plan:  ?OK to keep working on Optometrist.  Option to see allergist to domesticate cat allergy. ?Endorse Buspar -- seems to be helpful, quieting anxious distractions ?Endorse considering whether to bring down stimulant use/schedule -- self-monitor for anxiety response vs. Clarity and drive he sought stimulant for, and go with whatever is the better balance.  Consult PRN with psychiatry. ?Continue mental and tangible preparations for move to [place], including agenda for tx with mother -- may include tangible plans who needs to do what, emotional closure messages needed, acknowledgements (e.g., coming out as bi, worrying about her) ?Other recommendations/advice as may be noted above ?Continue to utilize previously learned skills ad lib ?Maintain medication as prescribed and work faithfully with relevant prescriber(s) if any changes are desired or seem indicated ?Call the clinic on-call service, 988/hotline, 911, or present to [facility] or ER if any life-threatening psychiatric crisis ?Return for session(s) already scheduled. ?Already scheduled visit in this office 10/17/2021. ? ?[name], PhD ?Marliss Czar, PhD LP ?Clinical Psychologist, [facility] ?[facility] ?[address] ?[place], [place] [address] ?(o) [phone] ?

## 2021-10-10 NOTE — Telephone Encounter (Signed)
Patient called requesting a refill on his Adderall XR 30mg  capsule. The medication was sent to [facility] on [address]; however, pt's Adderall is on backorder there. Writer s/w the pharmacist at [facility] on [address] in [place] and they have plenty. Please resend pt's Adderall XR 30mg  to [facility] on [address] Thank you ?

## 2021-10-11 ENCOUNTER — Other Ambulatory Visit (HOSPITAL_COMMUNITY): Payer: Self-pay | Admitting: Physician Assistant

## 2021-10-11 DIAGNOSIS — F909 Attention-deficit hyperactivity disorder, unspecified type: Secondary | ICD-10-CM

## 2021-10-11 MED ORDER — AMPHETAMINE-DEXTROAMPHET ER 30 MG PO CP24: 30.0000 mg | ORAL_CAPSULE | Freq: Every day | ORAL | 0 refills | Status: DC

## 2021-10-11 NOTE — Progress Notes (Signed)
Provider was contacted by Pamela E. Noveck, CMA regarding patient's request for Adderall to be sent to different pharmacy.  Patient's medication to be e-prescribed to preferred pharmacy.

## 2021-10-11 NOTE — Telephone Encounter (Signed)
Provider was contacted by Alinda Money, CMA regarding patient's request for Adderall to be sent to different pharmacy.  Patient's medication to be e-prescribed to preferred pharmacy.

## 2021-10-12 ENCOUNTER — Telehealth (HOSPITAL_COMMUNITY): Payer: Self-pay

## 2021-10-12 ENCOUNTER — Other Ambulatory Visit (HOSPITAL_COMMUNITY): Payer: Self-pay | Admitting: Physician Assistant

## 2021-10-12 DIAGNOSIS — F909 Attention-deficit hyperactivity disorder, unspecified type: Secondary | ICD-10-CM

## 2021-10-12 MED ORDER — AMPHETAMINE-DEXTROAMPHET ER 30 MG PO CP24: 30.0000 mg | ORAL_CAPSULE | Freq: Every day | ORAL | 0 refills | Status: DC

## 2021-10-12 NOTE — Telephone Encounter (Signed)
Provider was contacted by Latina Craver, CMA requesting that patient medication be released today.  Provider resent patient's medication to preferred pharmacy.

## 2021-10-12 NOTE — Telephone Encounter (Signed)
Writer called pt to let him know Adderall had been sent in; however, the pharmacy can't fill it for him because the start date is 10/20/21. Can you resend it with today's start date so the patient can pick it up today and have it for the weekend? Thanks Eddie ?

## 2021-10-15 NOTE — Telephone Encounter (Signed)
NOTIFIED PATIENT °

## 2021-10-17 ENCOUNTER — Ambulatory Visit (INDEPENDENT_AMBULATORY_CARE_PROVIDER_SITE_OTHER): Payer: 59 | Admitting: Psychiatry

## 2021-10-17 ENCOUNTER — Other Ambulatory Visit: Payer: Self-pay

## 2021-10-17 DIAGNOSIS — Z638 Other specified problems related to primary support group: Secondary | ICD-10-CM

## 2021-10-17 DIAGNOSIS — F909 Attention-deficit hyperactivity disorder, unspecified type: Secondary | ICD-10-CM

## 2021-10-17 DIAGNOSIS — F411 Generalized anxiety disorder: Secondary | ICD-10-CM

## 2021-10-17 DIAGNOSIS — F331 Major depressive disorder, recurrent, moderate: Secondary | ICD-10-CM | POA: Diagnosis not present

## 2021-10-17 NOTE — Progress Notes (Signed)
Psychotherapy Progress Note ?[facility] ?Marliss Czar, PhD LP ? ?Patient ID: Tracy Robinson [facility])    MRN: [id number] ?Therapy format: Individual psychotherapy ?Date: 10/17/2021      Start: 9:15a     Stop: 10:03a     Time Spent: 48 min ?Location: In-person  ? ?Session narrative (presenting needs, interim history, self-report of stressors and symptoms, applications of prior therapy, status changes, and interventions made in session) ?Likes Buspar, really likes it now.  Anxiety way down, much less with racing thoughts and worry.  Finally picked up Adderall but hasn't taken it again yet.  Does want to negotiate with psychiatrist for PRN use of stimulant rather than full-time, figures it really works better situationally and sometimes may contribute to anxiety.  Particularly helpful when he needs the boost to handle clutter or socializing when apprehensive.  Endorsed trying selective days, notify psychiatry and be ready to explain why the change. ? ?Short on sleep last couple nights.  Basically just let up and indulged the wish to stay up and get "me time" after a day of feeling responsible.  Today is stacked up with some time watching niece and other things, acknowledged some apprehension about trying to serve this day tired.  Showed breathing techniques for alertness and energy to some good effect.   ? ?Acknowledges facing a deadline this Friday for submitting to a [employer].  2 days to produce 1 or 2 submissions.  Has a couple works of his that are decent candidates but need to pare down.  Discussed tactics for breaking out a right-sized topic/message and for breaking down perfectionistic pressures to get it exactly right (and subvert the attempt to submit). ? ?Wants to vent about mom's BF, "T.L.".  Lame, boorish, hx of manipulative suicidal threat and seeing another woman, and mom divided about him, hangs onto him but really doesn't like him that much and wants him to stop  pestering.  Reviewed his needs, agreed there is a certain amount of let mom work it out for herself, but Ok to ask her for direction what she wants Neftaly to do -- besides "not worry" about her -- be more loyal to her best interest as he sees it, which is don't get mired with this guy, or be loyal to he choice and try to accommodate/tolerate him better.  Agrees he'll probe that with her. ? ?Therapeutic modalities: Cognitive Behavioral Therapy and Solution-Oriented/Positive Psychology ? ?Mental Status/Observations: ? ?Appearance:   Casual     ?Behavior:  Appropriate  ?Motor:  Normal  ?Speech/Language:   Clear and Coherent  ?Affect:  Appropriate  ?Mood:  anxious and less , tired  ?Thought process:  normal  ?Thought content:    WNL  ?Sensory/Perceptual disturbances:    WNL  ?Orientation:  Fully oriented  ?Attention:  Good  ?  ?Concentration:  Good  ?Memory:  WNL  ?Insight:    Good  ?Judgment:   Good  ?Impulse Control:  Good  ? ?Risk Assessment: ?Danger to Self: No Self-injurious Behavior: No ?Danger to Others: No Physical Aggression / Violence: No ?Duty to Warn: No Access to Firearms a concern: No ? ?Assessment of progress:  progressing ? ?Diagnosis: ?  ICD-10-CM   ?1. Generalized anxiety disorder  F41.1   ?  ?2. Attention deficit hyperactivity disorder (ADHD), unspecified ADHD type  F90.9   ?  ?3. Moderate episode of recurrent major depressive disorder (HCC)  F33.1   ?  ?4. Relationship problem with family members  Z63.8   ?  ? ?Plan:  ?Press through today without any substantial nap, see bedtime with substantial drive for sleep and let it happen. ?Practice consent to close the day, get sleep, and make the investment in tomorrow's capacity to do things that feel good.  Will work out more efficiently and effectively overall. ?Today/tomorrow press through with conference submission, starting with the paper that can be pared down to 1 case study, not 3.  If need be, do a junk version to get nerves out but start with the  more accessible one and then see if breaking a piece out of dissertation is possible.   If not, content to submit one, and content to be rejected, just successfully try a submission. ?Address Mom when ready for guidance (and stealth therapy for her) how to view T.L. an what she wants from [place] in re.  Fair to choose loyalty to Mom's best interest (which might be opposing him) or to her choice (could go either way), but either way is dutiful if he knows her wishes.  May compare to the challenge of the parent of an adult child to let the chooser work learning his/her choice. ?OK to keep working on Chiropodist.  Option to see allergist to domesticate cat allergy. ?Endorse Buspar -- seems to be helpful, quieting anxious distractions ?Endorse considering whether to bring down stimulant use/schedule -- self-monitor for anxiety response vs. Clarity and drive he sought stimulant for, and go with whatever is the better balance.  Consult PRN with psychiatry. ?Continue mental and tangible preparations for move to [place], including agenda for tx with mother -- may include tangible plans who needs to do what, emotional closure messages needed, acknowledgements (e.g., coming out as bi, worrying about her) ?Other recommendations/advice as may be noted above ?Continue to utilize previously learned skills ad lib ?Maintain medication as prescribed and work faithfully with relevant prescriber(s) if any changes are desired or seem indicated ?Call the clinic on-call service, 988/hotline, 911, or present to [facility] or ER if any life-threatening psychiatric crisis ?Return for session(s) already scheduled. ?Already scheduled visit in this office 10/24/2021. ? ?[name], PhD ?Marliss Czar, PhD LP ?Clinical Psychologist, [facility] ?[facility] ?[address] ?[place], [place] [address] ?(o) [phone] ?

## 2021-10-24 ENCOUNTER — Ambulatory Visit (INDEPENDENT_AMBULATORY_CARE_PROVIDER_SITE_OTHER): Payer: 59 | Admitting: Psychiatry

## 2021-10-24 DIAGNOSIS — F909 Attention-deficit hyperactivity disorder, unspecified type: Secondary | ICD-10-CM

## 2021-10-24 DIAGNOSIS — F331 Major depressive disorder, recurrent, moderate: Secondary | ICD-10-CM

## 2021-10-24 DIAGNOSIS — G479 Sleep disorder, unspecified: Secondary | ICD-10-CM

## 2021-10-24 DIAGNOSIS — F411 Generalized anxiety disorder: Secondary | ICD-10-CM

## 2021-10-24 DIAGNOSIS — Z638 Other specified problems related to primary support group: Secondary | ICD-10-CM

## 2021-10-24 DIAGNOSIS — F1910 Other psychoactive substance abuse, uncomplicated: Secondary | ICD-10-CM

## 2021-10-24 NOTE — Progress Notes (Signed)
Psychotherapy Progress Note [facility] Marliss Czar, PhD LP  Patient ID: Tracy Robinson [facility])    MRN: [id number] Therapy format: Individual psychotherapy Date: 10/24/2021      Start: 11:00a     Stop: 11:50a     Time Spent: 50 min Location: In-person   Session narrative (presenting needs, interim history, self-report of stressors and symptoms, applications of prior therapy, status changes, and interventions made in session) Celebrations -- checked on his deadline, which turned out to be Wed, not Fri, hoofed it, and got his to conference submissions in that night.  Bonus to look at his previous writing and found it was much closer to ready than he thought.  Read the book his mentor recommended, mostly knew what was in it, but proud of following through.  Also sold of some Magic cards, accomplishing some decluttering, some profit, and initiating a put off task all in one.  Has been decidedly respectful to T.L., felt good about doing so, even without having a clarifying conversation with mother about her intentions.  Knows she tends to be terminally indeterminate about these things, and he's not mean, just coarse.  Despite all that, says he's been surprisingly down the last few days.  Underslept, caught up Monday.  Hit or miss lately taming the habit of eating in bed.  A/C has been out, so some stuffy at night, plus seasonal allergies in full flare.  Interpreted as mostly combination of circadian disruption and allergic response by emotional brain.    Returned to subject of his impulse to smooth over fights (jumping into action to either deny problems, take the burden, or compulsively not need anything), refreshing the understanding that it's a prosocial instinct for family in tension, and refreshing what he can do to tame it -- mainly, acknowledge it's trying to happen, normalize it as an overlearned natural childhood response to help the family, and try to ask self or others if it's  truly necessary right now.  Re. substances, has been decidedly not drinking.  Running better, for one.  Did indulge 1 beer each of three days, but stopped.  Re. Pot, harder to manage.  Discussed what it delivers and what it spoils.  Discussed implementing arbitrary delay -- 15 min or so.  Also addressed alternatives to chewing, as munching has been a night soother, too -- chewing a straw, clenching fists, doing a muscle relaxation, using his Calm app.   Sister, on mood stabilizer, been forgetting it, asking him to remind her.  Afraid she's going into another mania, given arguing with BF, not sleeping or eating well, and in the past she's blown relationships and jobs.  Support/empathy provided.   Therapeutic modalities: Cognitive Behavioral Therapy, Solution-Oriented/Positive Psychology, and Ego-Supportive  Mental Status/Observations:  Appearance:   Casual     Behavior:  Appropriate  Motor:  Normal  Speech/Language:   Clear and Coherent  Affect:  Appropriate  Mood:  anxious and dysthymic  Thought process:  normal  Thought content:    WNL  Sensory/Perceptual disturbances:    WNL  Orientation:  Fully oriented  Attention:  Good    Concentration:  Good  Memory:  WNL  Insight:    Good  Judgment:   Good  Impulse Control:  Fair   Risk Assessment: Danger to Self: No Self-injurious Behavior: No Danger to Others: No Physical Aggression / Violence: No Duty to Warn: No Access to Firearms a concern: No  Assessment of progress:  progressing  Diagnosis:  ICD-10-CM   1. Generalized anxiety disorder  F41.1     2. Moderate episode of recurrent major depressive disorder (HCC)  F33.1     3. Polysubstance abuse (HCC) -- alcohol, marijuana  F19.10     4. Attention deficit hyperactivity disorder (ADHD), unspecified ADHD type  F90.9     5. Relationship problem with family members  Z63.8     6. Sleep disturbances  G47.9      Plan:  Continue restraining alcohol, try to abstain from pot.   AA/NA available Review efforts to get adequate sleep, understanding of depends on restoring environmental comfort Reframe impulse to smooth over family fights, try instead to draw out listening and rational decision making in loved ones Continue working on dissertation and related work as able Other recommendations/advice as may be noted above Continue to utilize previously learned skills ad lib Maintain medication as prescribed and work faithfully with relevant prescriber(s) if any changes are desired or seem indicated Call the clinic on-call service, 988/hotline, 911, or present to [facility] or ER if any life-threatening psychiatric crisis Return for session(s) already scheduled. Already scheduled visit in this office 10/31/2021.  [name], PhD Marliss Czar, PhD LP Clinical Psychologist, [facility] [address], [address] [place], [place] [address] [phone]

## 2021-10-31 ENCOUNTER — Ambulatory Visit (INDEPENDENT_AMBULATORY_CARE_PROVIDER_SITE_OTHER): Payer: 59 | Admitting: Psychiatry

## 2021-10-31 DIAGNOSIS — F331 Major depressive disorder, recurrent, moderate: Secondary | ICD-10-CM

## 2021-10-31 DIAGNOSIS — Z638 Other specified problems related to primary support group: Secondary | ICD-10-CM

## 2021-10-31 DIAGNOSIS — F909 Attention-deficit hyperactivity disorder, unspecified type: Secondary | ICD-10-CM | POA: Diagnosis not present

## 2021-10-31 DIAGNOSIS — F1910 Other psychoactive substance abuse, uncomplicated: Secondary | ICD-10-CM

## 2021-10-31 DIAGNOSIS — F411 Generalized anxiety disorder: Secondary | ICD-10-CM

## 2021-10-31 DIAGNOSIS — G479 Sleep disorder, unspecified: Secondary | ICD-10-CM

## 2021-10-31 NOTE — Progress Notes (Signed)
Psychotherapy Progress Note [facility] Marliss Czar, PhD LP  Patient ID: Tracy Robinson [facility])    MRN: [id number] Therapy format: Individual psychotherapy Date: 10/31/2021      Start: 8:13a     Stop: 9:03a     Time Spent: 50 min Location: In-person   Session narrative (presenting needs, interim history, self-report of stressors and symptoms, applications of prior therapy, status changes, and interventions made in session) New referral to allergist, on Singulair now.  Also on a Rx gas med.  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  On pace to do the event.  Normal sleeping, attributes to planning something to get up for.  Has made good use of muscle clenching and brief hyperventilation to wake up.  Using the strategy of doing anything else for 15 min before smoking, and the 15 min is typically very productive.  More aware of not feeling so much like getting stoned.  Drank a little more, but socially.  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  Figured out to back up trazodone a bit.  Finds himself accurately coaching himself to consent to sleep.  Affirmed and encouraged.  Sister had MVA, physically fine but car totalled, Friday, while Mom was on vacation and on the way to drop off [place] with [name].  Being there with [name] getting urgent to get the next car and overcommitting, mom's nervousness, was nerve-wracking.  Did find that M did not get sucked into underwriting the new car again.  Encouraged in helping set reasonable limits and boundaries.  Been thinking about drugs and regrets.  May pick those up next session or later.  For now, encourage working sober habits and alternative stress management.  Therapeutic modalities: Cognitive Behavioral Therapy and Solution-Oriented/Positive Psychology  Mental Status/Observations:  Appearance:   Casual     Behavior:  Appropriate  Motor:  Normal   Speech/Language:   Clear and Coherent  Affect:  Appropriate  Mood:  anxious and dysthymic  Thought process:  normal  Thought content:    WNL  Sensory/Perceptual disturbances:    WNL  Orientation:  Fully oriented  Attention:  Good    Concentration:  Good  Memory:  WNL  Insight:    Good  Judgment:   Good  Impulse Control:  Good   Risk Assessment: Danger to Self: No Self-injurious Behavior: No Danger to Others: No Physical Aggression / Violence: No Duty to Warn: No Access to Firearms a concern: No  Assessment of progress:  progressing  Diagnosis:   ICD-10-CM   1. Generalized anxiety disorder  F41.1     2. Moderate episode of recurrent major depressive disorder (HCC)  F33.1     3. Polysubstance abuse (HCC) -- alcohol, marijuana  F19.10     4. Attention deficit hyperactivity disorder (ADHD), unspecified ADHD type  F90.9     5. Relationship problem with family members  Z63.8     6. Sleep disturbances  G47.9      Plan:  Endorse continued training for running event Continue restraining alcohol, try to abstain from pot.  AA/NA available.  Continue to use delay strategy for urges to use Review efforts to get adequate sleep, understanding of depends on restoring environmental comfort As able, draw out listening and rational decision making in loved ones, but still OK to make his own decisions about refocusing and relocating Continue working on dissertation and related work  as able Other recommendations/advice as may be noted above Continue to utilize previously learned skills ad lib Maintain medication as prescribed and work faithfully with relevant prescriber(s) if any changes are desired or seem indicated Call the clinic on-call service, 988/hotline, 911, or present to [facility] or ER if any life-threatening psychiatric crisis Return for session(s) already scheduled. Already scheduled visit in this office 11/08/2021.  [name], PhD Marliss Czar, PhD LP Clinical  Psychologist, [facility] [address] [place], [place] [address] [phone]

## 2021-11-07 ENCOUNTER — Ambulatory Visit: Payer: 59 | Admitting: Psychiatry

## 2021-11-08 ENCOUNTER — Ambulatory Visit (INDEPENDENT_AMBULATORY_CARE_PROVIDER_SITE_OTHER): Payer: 59 | Admitting: Psychiatry

## 2021-11-08 DIAGNOSIS — F1910 Other psychoactive substance abuse, uncomplicated: Secondary | ICD-10-CM

## 2021-11-08 DIAGNOSIS — F909 Attention-deficit hyperactivity disorder, unspecified type: Secondary | ICD-10-CM

## 2021-11-08 DIAGNOSIS — G479 Sleep disorder, unspecified: Secondary | ICD-10-CM

## 2021-11-08 DIAGNOSIS — F411 Generalized anxiety disorder: Secondary | ICD-10-CM | POA: Diagnosis not present

## 2021-11-08 DIAGNOSIS — F331 Major depressive disorder, recurrent, moderate: Secondary | ICD-10-CM | POA: Diagnosis not present

## 2021-11-08 NOTE — Progress Notes (Signed)
Psychotherapy Progress Note [facility] Marliss Czar, PhD LP  Patient ID: Tracy Robinson [facility])    MRN: [id number] Therapy format: Individual psychotherapy Date: 11/08/2021      Start: 9:12a     Stop: 10:02a     Time Spent: 50 min Location: In-person   Session narrative (presenting needs, interim history, self-report of stressors and symptoms, applications of prior therapy, status changes, and interventions made in session) Celebrations include rock climbing with friends, getting a Orthoptist, getting out amicably with mother and T.L.  Been able to take some "bites" of dissertation.  One advance has been to have office space made available by his mother's nursing agency.  Put in 3 hours yesterday, much of it refereeing his own distractions, but essentially on task.  Returning to [place] subject, realizes he has been putting off again.  Has given thought to intended look of his life next spring.  Discussed imaginal practice and imagining means-ends connections.  Coached in imagining responses to resistant moments -- take it to an office, fully express his resistance, do paradoxical "work" (write badly, e.g.), "take it physical" (move while thinking), "take it social" (speak it as to a friend, then write what's spoken).  All make sense to him as resistance breakers.  Also discussed tactics for organizing thoughts, like note cards and spatial reorg.  For self-encouragement, consider a thank-you note from the future, expressing gratitude for things Jamarcus of today came to do.  Therapeutic modalities: Cognitive Behavioral Therapy and Solution-Oriented/Positive Psychology  Mental Status/Observations:  Appearance:   Casual     Behavior:  Appropriate  Motor:  Normal  Speech/Language:   Clear and Coherent  Affect:  Appropriate  Mood:  anxious  Thought process:  normal  Thought content:    worry  Sensory/Perceptual disturbances:    WNL  Orientation:  Fully oriented   Attention:  Good    Concentration:  Good  Memory:  WNL  Insight:    Good  Judgment:   Good  Impulse Control:  Good   Risk Assessment: Danger to Self: No Self-injurious Behavior: No Danger to Others: No Physical Aggression / Violence: No Duty to Warn: No Access to Firearms a concern: No  Assessment of progress:  progressing  Diagnosis:   ICD-10-CM   1. Generalized anxiety disorder  F41.1     2. Moderate episode of recurrent major depressive disorder (HCC)  F33.1     3. Attention deficit hyperactivity disorder (ADHD), unspecified ADHD type  F90.9     4. Sleep disturbances  G47.9     5. Polysubstance abuse (HCC) -- alcohol, marijuana  F19.10      Plan:  Imaginal practice, and in vivo, handling resistant moments with dissertation -- take it to an office, fully express his resistance, do paradoxical "work" (write badly, e.g.), "take it physical" (move while thinking), "take it social" (speak as to a friend, then write what's spoken) Continue working on dissertation and related work as able, preferably in dedicated, Advertising account planner.  Use known organization tactics for work.  Option write thank-you note from the future for things he will have done to enable it. Bookmark to come back to guilt with grandmother Continue restraining alcohol, try to abstain from pot.  AA/NA available.  Continue to use delay strategy for urges to use. Keep working sleep hygiene measures, including turning loose of entertainment and tending needs for comfortable sleep environment When contending with loved ones' needs and dependencies, try to draw out listening and rational  decision making rather than feel responsible.  Always OK to make own decisions about refocusing and relocating. Other recommendations/advice as may be noted above Continue to utilize previously learned skills ad lib Maintain medication as prescribed and work faithfully with relevant prescriber(s) if any changes are desired or  seem indicated Call the clinic on-call service, 988/hotline, 911, or present to [facility] or ER if any life-threatening psychiatric crisis Return in about 2 weeks (around 11/22/2021) for session(s) already scheduled. Already scheduled visit in this office 11/22/2021.  [name], PhD Marliss Czar, PhD LP Clinical Psychologist, [facility] [address] [place], [place] [address] [phone]

## 2021-11-15 ENCOUNTER — Telehealth (INDEPENDENT_AMBULATORY_CARE_PROVIDER_SITE_OTHER): Payer: 59 | Admitting: Physician Assistant

## 2021-11-15 ENCOUNTER — Encounter (HOSPITAL_COMMUNITY): Payer: Self-pay | Admitting: Physician Assistant

## 2021-11-15 DIAGNOSIS — F411 Generalized anxiety disorder: Secondary | ICD-10-CM

## 2021-11-15 DIAGNOSIS — G479 Sleep disorder, unspecified: Secondary | ICD-10-CM

## 2021-11-15 DIAGNOSIS — F331 Major depressive disorder, recurrent, moderate: Secondary | ICD-10-CM | POA: Diagnosis not present

## 2021-11-15 DIAGNOSIS — F909 Attention-deficit hyperactivity disorder, unspecified type: Secondary | ICD-10-CM | POA: Diagnosis not present

## 2021-11-15 MED ORDER — SERTRALINE HCL 100 MG PO TABS: 200.0000 mg | ORAL_TABLET | Freq: Every day | ORAL | 2 refills | Status: DC

## 2021-11-15 MED ORDER — AMPHETAMINE-DEXTROAMPHET ER 30 MG PO CP24: 30.0000 mg | ORAL_CAPSULE | Freq: Every day | ORAL | 0 refills | Status: DC

## 2021-11-15 MED ORDER — TRAZODONE HCL 100 MG PO TABS: 100.0000 mg | ORAL_TABLET | Freq: Every day | ORAL | 2 refills | Status: DC

## 2021-11-15 MED ORDER — BUSPIRONE HCL 10 MG PO TABS: 10.0000 mg | ORAL_TABLET | Freq: Two times a day (BID) | ORAL | 1 refills | Status: DC

## 2021-11-18 NOTE — Progress Notes (Signed)
BH MD/PA/NP OP Progress Note ? ?Virtual Visit via Video Note ? ?I connected [name] on 11/15/21 at  3:30 PM EDT by a video enabled telemedicine application and verified that I am speaking with the correct person using two identifiers. ? ?Location: ?Patient: Home ?Provider: Clinic ?  ?I discussed the limitations of evaluation and management by telemedicine and the availability of in person appointments. The patient expressed understanding and agreed to proceed. ? ?Follow Up Instructions: ? ?I discussed the assessment and treatment plan with the patient. The patient was provided an opportunity to ask questions and all were answered. The patient agreed with the plan and demonstrated an understanding of the instructions. ?  ?The patient was advised to call back or seek an in-person evaluation if the symptoms worsen or if the condition fails to improve as anticipated. ? ?I provided 22 minutes of non-face-to-face time during this encounter. ? ?Meta Hatchet, PA ? ? ?11/15/2021 7:34 PM ?Tracy Robinson  ?MRN:  [id number] ? ?Chief Complaint:  ?Chief Complaint  ?Patient presents with  ? Follow-up  ? Medication Management  ? ?HPI:  ? ?Tacoma Merida is a 31 year old male with a past psychiatric history significant for generalized anxiety disorder, major depressive disorder, sleep disturbances, and attention deficit hyperactivity disorder who presents to [facility] via virtual video visit for follow-up and medication management.  Patient is currently being managed on the following medications: ? ?Adderall XR 30 mg 24-hour capsule daily ?Zoloft 200 mg daily ?Trazodone 100 mg at bedtime ?Buspirone 7.5 mg 2 times daily ? ?Patient reports that his use of buspirone 7.5 mg 2 times daily has been amazing.  Since being placed on buspirone, patient states that he is starting to feel more like himself again.  Patient states that whenever he takes the medication, he feels more wired and alert.   Patient rates his anxiety at 5 out of 10 and endorses the following stressors: planning return to [place] to work on Writer.  Patient endorses depression stating that he has been more aware of his depressive symptoms over the past few days.  Although he endorses depression, he states that his symptoms are manageable at this time.  Patient depressive symptoms include: difficulty getting out of bed, self-doubt, lack of motivation, and feelings of guilt.  Patient denies any recent major life changing events.  A PHQ-9 screen was performed with the patient scoring a 16.  A GAD-7 screen was also performed with the patient scoring a 19. ? ?Patient is alert and oriented x4, pleasant, calm, cooperative, and fully engaged in conversation during the encounter.  Patient described his mood as fine and being at peace.  Patient denies suicidal or homicidal ideations.  He further denies auditory or visual hallucinations and does not appear to be responding to internal/external stimuli.  Patient endorses good sleep and receives on average 8 hours of sleep each night.  Patient endorses decreased appetite and eats on average 2 meals per day.  Patient endorses alcohol consumption occasionally.  Patient denies tobacco use.  Patient endorses illicit drug use in the form of marijuana. ? ?Visit Diagnosis:  ?  ICD-10-CM   ?1. Generalized anxiety disorder  F41.1 busPIRone (BUSPAR) 10 MG tablet  ?  sertraline (ZOLOFT) 100 MG tablet  ?  ?2. Attention deficit hyperactivity disorder (ADHD), unspecified ADHD type  F90.9 amphetamine-dextroamphetamine (ADDERALL XR) 30 MG 24 hr capsule  ?  ?3. Moderate episode of recurrent major depressive disorder (HCC)  F33.1 sertraline (ZOLOFT)  100 MG tablet  ?  ?4. Sleep disturbances  G47.9 traZODone (DESYREL) 100 MG tablet  ?  ? ? ?Past Psychiatric History:  ?Major depressive disorder ?Generalized anxiety disorder ?ADHD ?Sleep disturbances ? ?Past Medical History: History reviewed. No pertinent past  medical history. History reviewed. No pertinent surgical history. ? ?Family Psychiatric History:  ?Father (deceased) - Alcoholism ?Sister - mood disorder. Patient states that his sister has also struggled with anorexia and bulimia ? ?Family History: History reviewed. No pertinent family history. ? ?Social History:  ?Social History  ? ?Socioeconomic History  ? Marital status: Single  ?  Spouse name: Not on file  ? Number of children: Not on file  ? Years of education: Not on file  ? Highest education level: Not on file  ?Occupational History  ? Not on file  ?Tobacco Use  ? Smoking status: Never  ? Smokeless tobacco: Never  ?Substance and Sexual Activity  ? Alcohol use: Not on file  ? Drug use: Not on file  ? Sexual activity: Not on file  ?Other Topics Concern  ? Not on file  ?Social History Narrative  ? Not on file  ? ?Social Determinants of Health  ? ?Financial Resource Strain: Not on file  ?Food Insecurity: Not on file  ?Transportation Needs: Not on file  ?Physical Activity: Not on file  ?Stress: Not on file  ?Social Connections: Not on file  ? ? ?Allergies: No Known Allergies ? ?Metabolic Disorder Labs: ?No results found for: HGBA1C, MPG ?No results found for: PROLACTIN ?No results found for: CHOL, TRIG, HDL, CHOLHDL, VLDL, LDLCALC ?No results found for: TSH ? ?Therapeutic Level Labs: ?No results found for: LITHIUM ?No results found for: VALPROATE ?No components found for:  CBMZ ? ?Current Medications: ?Current Outpatient Medications  ?Medication Sig Dispense Refill  ? amphetamine-dextroamphetamine (ADDERALL XR) 30 MG 24 hr capsule Take 1 capsule (30 mg total) by mouth daily. 30 capsule 0  ? busPIRone (BUSPAR) 10 MG tablet Take 1 tablet (10 mg total) by mouth 2 (two) times daily. 60 tablet 1  ? ondansetron (ZOFRAN) 4 MG tablet Take 1 tablet (4 mg total) by mouth daily as needed for nausea or vomiting. 30 tablet 0  ? sertraline (ZOLOFT) 100 MG tablet Take 2 tablets (200 mg total) by mouth daily. 60 tablet 2  ?  traZODone (DESYREL) 100 MG tablet Take 1 tablet (100 mg total) by mouth at bedtime. 30 tablet 2  ? ?No current facility-administered medications for this visit.  ? ? ? ?Musculoskeletal: ?Strength & Muscle Tone: Unable to assess due to telemedicine visit ?Gait & Station: Unable to assess due to telemedicine visit ?Patient leans: Unable to assess due to telemedicine visit ? ?Psychiatric Specialty Exam: ?Review of Systems  ?Psychiatric/Behavioral:  Negative for decreased concentration, dysphoric mood, hallucinations, self-injury, sleep disturbance and suicidal ideas. The patient is nervous/anxious. The patient is not hyperactive.    ?There were no vitals taken for this visit.There is no height or weight on file to calculate BMI.  ?General Appearance: Unable to assess due to telemedicine visit  ?Eye Contact:  Unable to assess due to telemedicine visit  ?Speech:  Clear and Coherent and Normal Rate  ?Volume:  Normal  ?Mood:  Anxious and Depressed  ?Affect:  Congruent  ?Thought Process:  Coherent, Goal Directed, and Descriptions of Associations: Intact  ?Orientation:  Full (Time, Place, and Person)  ?Thought Content: WDL   ?Suicidal Thoughts:  No  ?Homicidal Thoughts:  No  ?Memory:  Immediate;  Good ?Recent;   Good ?Remote;   Good  ?Judgement:  Good  ?Insight:  Good  ?Psychomotor Activity:  Normal  ?Concentration:  Concentration: Good and Attention Span: Good  ?Recall:  Good  ?Fund of Knowledge: Good  ?Language: Good  ?Akathisia:  No  ?Handed:  Right  ?AIMS (if indicated): not done  ?Assets:  Communication Skills ?Desire for Improvement ?Housing ?Social Support ?Vocational/Educational  ?ADL's:  Intact  ?Cognition: WNL  ?Sleep:  Good  ? ?Screenings: ?GAD-7   ? ?Flowsheet Row [facility] Clinical Support [facility]2021 [facility]  ?Total GAD-7 Score 19 19 19 19 18   ? ?  ? ?PHQ2-9   ? ?Flowsheet Row [facility]

## 2021-11-22 ENCOUNTER — Ambulatory Visit: Payer: 59 | Admitting: Psychiatry

## 2021-11-23 ENCOUNTER — Ambulatory Visit (INDEPENDENT_AMBULATORY_CARE_PROVIDER_SITE_OTHER): Payer: 59 | Admitting: Psychiatry

## 2021-11-23 DIAGNOSIS — F1910 Other psychoactive substance abuse, uncomplicated: Secondary | ICD-10-CM

## 2021-11-23 DIAGNOSIS — F909 Attention-deficit hyperactivity disorder, unspecified type: Secondary | ICD-10-CM | POA: Diagnosis not present

## 2021-11-23 DIAGNOSIS — F331 Major depressive disorder, recurrent, moderate: Secondary | ICD-10-CM | POA: Diagnosis not present

## 2021-11-23 DIAGNOSIS — G479 Sleep disorder, unspecified: Secondary | ICD-10-CM

## 2021-11-23 DIAGNOSIS — F411 Generalized anxiety disorder: Secondary | ICD-10-CM | POA: Diagnosis not present

## 2021-11-23 NOTE — Progress Notes (Signed)
Psychotherapy Progress Note [facility] Marliss Czar, PhD LP  Patient ID: Tracy Robinson [facility])    MRN: [id number] Therapy format: Individual psychotherapy Date: 11/23/2021      Start: 5:05p     Stop: 5:55p     Time Spent: 50 min Location: In-person   Session narrative (presenting needs, interim history, self-report of stressors and symptoms, applications of prior therapy, status changes, and interventions made in session) "Dude, feel like I've been off the wagon, need to be in here."  Relapsed drinking regularly, starting 2 or 3 days before mom and her BF out of town.  With it some sleep schedule discipline.  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  Tuesday [name] called his mom, about whether to forward some mail.  Breaking that open again put him back in quandaries about contacting her, whether he owes her something still.  Assured he does not have to engage her, no obligation, and first priority needs to be sobriety.  Agrees, will readdress.  Therapeutic modalities: Cognitive Behavioral Therapy, Solution-Oriented/Positive Psychology, 12-Step, and Motivational Interviewing  Mental Status/Observations:  Appearance:   Casual     Behavior:  Appropriate  Motor:  Normal  Speech/Language:   Clear and Coherent  Affect:  Appropriate  Mood:  depressed  Thought process:  normal  Thought content:    Rumination  Sensory/Perceptual disturbances:    WNL  Orientation:  Fully oriented  Attention:  Good    Concentration:  Fair  Memory:  WNL  Insight:    Good  Judgment:   Good  Impulse Control:  Fair   Risk Assessment: Danger to Self: No Self-injurious Behavior: No Danger to Others: No Physical Aggression / Violence: No Duty to Warn: No Access to Firearms a concern: No  Assessment of progress:  situational setback(s)  Diagnosis:   ICD-10-CM   1.  Polysubstance abuse (HCC) -- alcohol, marijuana  F19.10     2. Moderate episode of recurrent major depressive disorder (HCC)  F33.1     3. Generalized anxiety disorder  F41.1     4. Attention deficit hyperactivity disorder (ADHD), unspecified ADHD type  F90.9     5. Sleep disturbances  G47.9      Plan:  Restrain alcohol, try to abstain from pot.  AA/NA available.  Continue to use delay strategy for urges to use. Imaginal practice, and in vivo, handling resistant moments with dissertation -- take it to an office, fully express his resistance, do paradoxical "work" (write badly, e.g.), "take it physical" (move while thinking), "take it social" (speak as to a friend, then write what's spoken) Continue working on dissertation and related work as able, preferably in dedicated, Advertising account planner.  Use known organization tactics for work.  Option write thank-you note from the future for things he will have done to enable it. Still look to relocation to [facility] as most likely way to get reengaged in dissertation and career Bookmark to come back to guilt with grandmother Keep working sleep hygiene measures, including turning loose of entertainment and tending needs for comfortable sleep environment When contending with loved ones' needs and dependencies, try to draw out listening and rational decision making rather than feel responsible.  Always OK to make own decisions about refocusing and relocating. Other recommendations/advice as may be noted above Continue to utilize previously learned skills ad lib Maintain medication as prescribed and work faithfully with  relevant prescriber(s) if any changes are desired or seem indicated Call the clinic on-call service, 988/hotline, 911, or present to [facility] or ER if any life-threatening psychiatric crisis Return for session(s) already scheduled. Already scheduled visit in this office 11/29/2021.  [name], PhD Marliss Czar, PhD LP Clinical  Psychologist, [facility] [address] [place], [place] [address] [phone]

## 2021-11-25 ENCOUNTER — Other Ambulatory Visit (HOSPITAL_COMMUNITY): Payer: Self-pay | Admitting: Physician Assistant

## 2021-11-25 DIAGNOSIS — F411 Generalized anxiety disorder: Secondary | ICD-10-CM

## 2021-11-25 DIAGNOSIS — G479 Sleep disorder, unspecified: Secondary | ICD-10-CM

## 2021-11-28 ENCOUNTER — Other Ambulatory Visit (HOSPITAL_COMMUNITY): Payer: Self-pay | Admitting: Rheumatology

## 2021-11-28 DIAGNOSIS — M545 Low back pain, unspecified: Secondary | ICD-10-CM

## 2021-11-29 ENCOUNTER — Ambulatory Visit (INDEPENDENT_AMBULATORY_CARE_PROVIDER_SITE_OTHER): Payer: 59 | Admitting: Psychiatry

## 2021-11-29 DIAGNOSIS — G479 Sleep disorder, unspecified: Secondary | ICD-10-CM

## 2021-11-29 DIAGNOSIS — F1910 Other psychoactive substance abuse, uncomplicated: Secondary | ICD-10-CM

## 2021-11-29 DIAGNOSIS — F331 Major depressive disorder, recurrent, moderate: Secondary | ICD-10-CM

## 2021-11-29 DIAGNOSIS — F411 Generalized anxiety disorder: Secondary | ICD-10-CM | POA: Diagnosis not present

## 2021-11-29 DIAGNOSIS — F909 Attention-deficit hyperactivity disorder, unspecified type: Secondary | ICD-10-CM

## 2021-11-29 NOTE — Progress Notes (Signed)
Psychotherapy Progress Note [facility] Marliss Czar, PhD LP  Patient ID: Tracy Robinson [facility])    MRN: [id number] Therapy format: Individual psychotherapy Date: 11/29/2021      Start: 4:08p     Stop: 4:55p     Time Spent: 47 min Location: In-person   Session narrative (presenting needs, interim history, self-report of stressors and symptoms, applications of prior therapy, status changes, and interventions made in session) Been processing the [facility] stuff more, wants to get to it most.  Decided he will only drink on Saturdays, and a family beach trip will help reboot his habits, including not taking work, will enjoy family members, and sleep schedule will be sensible.  Affirmed discretion, moderation, and efforts to reorder sleep.  Messaged [name], no reply.  Now quandary whether to follow up or whether it would be an offense of some kind.  Discussed, resolved equal options to let silence run and content himself with not knowing, or ask for clarification in a patently nondemanding way, and then be content with the possibility of hearing clarifying if that's how she elects to respond.  Either way, let himself off the hook for having to speak perfectly or manage meanings for [name], since experience says she is capable of turning anything into tragedy and pathos, actually.  Getting aggravated by feeling the obligation to clean bathroom.  Has some responsibility, but the preoccupation is consuming sometimes, and he still becomes paralyzed to action.  Analyzed as perfectionism and all/none thinking.  Advised try cleaning half the bathroom as an exercise is allowing partway done, or flip a coin and commit to do what it says as an exercise in surrendering control.  Did put in a bit of dissertation time past week, felt good to make progress.  Affirmed and encouraged.  Therapeutic modalities: Cognitive Behavioral Therapy, Solution-Oriented/Positive Psychology, and Ego-Supportive  Mental  Status/Observations:  Appearance:   Casual     Behavior:  Appropriate  Motor:  Normal  Speech/Language:   Clear and Coherent  Affect:  Appropriate  Mood:  More hopeful  Thought process:  normal  Thought content:    WNL  Sensory/Perceptual disturbances:    WNL  Orientation:  Fully oriented  Attention:  Good    Concentration:  Good  Memory:  WNL  Insight:    Good  Judgment:   Good  Impulse Control:  Good   Risk Assessment: Danger to Self: No Self-injurious Behavior: No Danger to Others: No Physical Aggression / Violence: No Duty to Warn: No Access to Firearms a concern: No  Assessment of progress:  progressing  Diagnosis:   ICD-10-CM   1. Moderate episode of recurrent major depressive disorder (HCC)  F33.1     2. Polysubstance abuse (HCC) -- alcohol, marijuana  F19.10     3. Generalized anxiety disorder  F41.1     4. Attention deficit hyperactivity disorder (ADHD), unspecified ADHD type  F90.9     5. Sleep disturbances  G47.9      Plan:  Restrain alcohol, try to abstain from pot.  AA/NA available.  Continue to use delay strategy for urges to use. Keep working sleep hygiene measures, including turning loose of entertainment and tending needs for comfortable sleep environment Continue working on dissertation and related work as able, preferably in dedicated, Advertising account planner.  Use known organization tactics for work.  Option write thank-you note from the future for things he will have done to enable it. Use anti-resistance measures for dissertation -- take it to  an office, fully express not wanting to, do paradoxical "work" (write badly, e.g.), "take it physical" (move while thinking), "take it social" (speak as to a friend, then write what's spoken) Still look to relocation to [place] as most likely way to get reengaged in dissertation and career When contending with loved ones' needs and dependencies, try to draw out listening and rational decision making rather  than feel responsible.  Always OK to make own decisions about refocusing, relocating, and letting them work with their own needs to learn better for themselves Other recommendations/advice as may be noted above Continue to utilize previously learned skills ad lib Maintain medication as prescribed and work faithfully with relevant prescriber(s) if any changes are desired or seem indicated Call the clinic on-call service, 988/hotline, 911, or present to [facility] or ER if any life-threatening psychiatric crisis Return for session(s) already scheduled. Already scheduled visit in this office 12/18/2021.  [name], PhD Marliss Czar, PhD LP Clinical Psychologist, [facility] [address] [place], [place] [address] [phone]

## 2021-11-30 ENCOUNTER — Other Ambulatory Visit (HOSPITAL_COMMUNITY): Payer: Self-pay | Admitting: Physician Assistant

## 2021-11-30 ENCOUNTER — Telehealth (HOSPITAL_COMMUNITY): Payer: Self-pay | Admitting: *Deleted

## 2021-11-30 DIAGNOSIS — F909 Attention-deficit hyperactivity disorder, unspecified type: Secondary | ICD-10-CM

## 2021-11-30 MED ORDER — AMPHETAMINE-DEXTROAMPHET ER 30 MG PO CP24: 30.0000 mg | ORAL_CAPSULE | Freq: Every day | ORAL | 0 refills | Status: DC

## 2021-11-30 NOTE — Progress Notes (Signed)
Provider was contacted by Orpah Clinton. Beck RN regarding for patient's request for Adderall to be prescribed to different pharmacy due to current pharmacy being out.  Patient's Adderall to be e-prescribed to [facility] pharmacy on [address]. ?

## 2021-11-30 NOTE — Telephone Encounter (Signed)
Call requesting his adderall be called in today to the [facility] on [address] because the [facility] he usually uses dont have it in stock. Additionally he is calling at noon with this request, wanting it done today because he is leaving for the beach tomorrow. WIll include this in the request to the provider. ?

## 2021-12-12 ENCOUNTER — Ambulatory Visit (HOSPITAL_COMMUNITY)
Admission: RE | Admit: 2021-12-12 | Discharge: 2021-12-12 | Disposition: A | Discharge status: Home / Self Care | Payer: 59 | Source: Ambulatory Visit | Attending: Rheumatology | Admitting: Rheumatology

## 2021-12-12 DIAGNOSIS — M545 Low back pain, unspecified: Secondary | ICD-10-CM | POA: Diagnosis present

## 2021-12-18 ENCOUNTER — Ambulatory Visit (INDEPENDENT_AMBULATORY_CARE_PROVIDER_SITE_OTHER): Payer: 59 | Admitting: Psychiatry

## 2021-12-18 DIAGNOSIS — F909 Attention-deficit hyperactivity disorder, unspecified type: Secondary | ICD-10-CM

## 2021-12-18 DIAGNOSIS — F1910 Other psychoactive substance abuse, uncomplicated: Secondary | ICD-10-CM

## 2021-12-18 DIAGNOSIS — F411 Generalized anxiety disorder: Secondary | ICD-10-CM

## 2021-12-18 DIAGNOSIS — F331 Major depressive disorder, recurrent, moderate: Secondary | ICD-10-CM | POA: Diagnosis not present

## 2021-12-18 NOTE — Progress Notes (Signed)
Psychotherapy Progress Note [facility] Marliss Czar, PhD LP  Patient ID: Tracy Robinson [facility])    MRN: [id number] Therapy format: Individual psychotherapy Date: 12/18/2021      Start: 8:17a     Stop: 9:00a     Time Spent: 43 min Location: In-person   Session narrative (presenting needs, interim history, self-report of stressors and symptoms, applications of prior therapy, status changes, and interventions made in session) Dissertation procrastinated again, largely, but has collected and integrated documents and dressed up some writing.  It does help to apply the coping thought that this is his opportunity to break up resistance.  Took advantage of a private moment to take a cold shower and scream, which helped loosen up motivation.  Has used half bathroom cleaning, coin flip maneuver, and pulling out one load of laundry to break up monolithic tasks and resistance.  Picked up more yesterday, but never made it to valuing the work he did.  Depressed mood of late, too hard to find the future value, caught between things he doesn't want to do.  Resolved to do some clearing of things that need to trash or donate.  Clear that the environment he's in is not conducive, with his stuff spread across 3 rooms, and helping his environment will help himself.    Struggle continues to resist compulsive caretaking with mother and sister, but has gotten further exercise and setting limits, saying no, at least temporarily, to interrupting bids to pull aside to do something when he is engaged.  Encouraged in being able to articulate when he wants or needs to finish a thought, finish a purpose, and come back.  Agreed that the family habit of interrupting is in need of some modification, especially if he is to better overcome his own ADHD tendencies.  Shares a funny video that helps him poke fun at his anxiety ("Why Am I Anxious?" By Lawernce Pitts).   Therapeutic modalities: Cognitive Behavioral Therapy and  Solution-Oriented/Positive Psychology  Mental Status/Observations:  Appearance:   Casual     Behavior:  Appropriate  Motor:  Normal  Speech/Language:   Clear and Coherent  Affect:  Appropriate  Mood:  dysthymic  Thought process:  normal  Thought content:    WNL  Sensory/Perceptual disturbances:    WNL  Orientation:  Fully oriented  Attention:  Good    Concentration:  Good  Memory:  WNL  Insight:    Good  Judgment:   Good  Impulse Control:  Good   Risk Assessment: Danger to Self: No Self-injurious Behavior: No Danger to Others: No Physical Aggression / Violence: No Duty to Warn: No Access to Firearms a concern: No  Assessment of progress:  progressing  Diagnosis:   ICD-10-CM   1. Moderate episode of recurrent major depressive disorder (HCC)  F33.1     2. Generalized anxiety disorder  F41.1     3. Attention deficit hyperactivity disorder (ADHD), unspecified ADHD type  F90.9     4. Polysubstance abuse (HCC) -- alcohol, marijuana  F19.10      Plan:  Priority address home and working environment to D clutter and reduce distracting stimuli Use variety of antiprocrastination techniques Continuing endorsement to set limits and boundaries on distracting requests for attention and effort Continue efforts to restrain substance use and gaming as distractions and self-medication, establish sober coping methods, use delay strategies.  12-step support available if interested. May come back to issues of regret, including GF, GM, substance abuse issues Other recommendations/advice as  may be noted above Continue to utilize previously learned skills ad lib Maintain medication as prescribed and work faithfully with relevant prescriber(s) if any changes are desired or seem indicated Call the clinic on-call service, 988/hotline, 911, or present to [facility] or ER if any life-threatening psychiatric crisis Return for session(s) already scheduled. Already scheduled visit in this office  12/27/2021.  [name], PhD Marliss Czar, PhD LP Clinical Psychologist, [facility] [address], [address] [place], [place] [address] [phone]

## 2021-12-20 NOTE — Telephone Encounter (Signed)
Message acknowledged and reviewed.

## 2021-12-27 ENCOUNTER — Ambulatory Visit: Payer: 59 | Admitting: Psychiatry

## 2022-01-04 ENCOUNTER — Ambulatory Visit (INDEPENDENT_AMBULATORY_CARE_PROVIDER_SITE_OTHER): Payer: 59 | Admitting: Psychiatry

## 2022-01-04 DIAGNOSIS — F909 Attention-deficit hyperactivity disorder, unspecified type: Secondary | ICD-10-CM | POA: Diagnosis not present

## 2022-01-04 DIAGNOSIS — F411 Generalized anxiety disorder: Secondary | ICD-10-CM

## 2022-01-04 DIAGNOSIS — G479 Sleep disorder, unspecified: Secondary | ICD-10-CM

## 2022-01-04 DIAGNOSIS — F1910 Other psychoactive substance abuse, uncomplicated: Secondary | ICD-10-CM

## 2022-01-04 DIAGNOSIS — F331 Major depressive disorder, recurrent, moderate: Secondary | ICD-10-CM

## 2022-01-04 NOTE — Progress Notes (Signed)
Psychotherapy Progress Note [facility] Tracy Czar, PhD LP  Patient ID: [facility])    MRN: [id number] Therapy format: Individual psychotherapy Date: 01/04/2022      Start: 10:15a     Stop: 11:05a     Time Spent: 50 min Location: In-person   Session narrative (presenting needs, interim history, self-report of stressors and symptoms, applications of prior therapy, status changes, and interventions made in session) Feeling punk lately.  Off Adderall a few days, maybe air quality issues.  Knows he makes different decisions about staying up when that happens.  Encouraged back to plan.  Enjoyed a wedding, going as friend Tracy Robinson's +1.  Tracy Robinson out with some good acquaintances past.  House sitting right now, which means quieter time, fewer provocations but more chance to ruminate about his state.  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  Discussed the disconnect he often faces doing something for "future Tracy Robinson".  Reframed as today's effort being gift to tomorrow's self, who may very much appreciate the help or resent the impediments.  Clued in that today's regrets expressed are maybe all the evidence he needs of how that works, and why it's worth it's always making a reasonable sacrifice today, because after the "miniature death" (his word) of sleep, "the Tracy Robinson body" he'll inhabit next stands a better chance of carrying off what he wants it to.  Discussed plans for the day, resolved to use 1 or 2 patches of free time to make calls about living spaces in [place], then take a bite out of dissertation work, either by polishing writing or reading and staying open to inspiration about where to find some data that has eluded him.   Therapeutic modalities: Cognitive Behavioral Therapy, Solution-Oriented/Positive Psychology, Environmental manager, and Motivational Interviewing  Mental Status/Observations:  Appearance:   Casual      Behavior:  Appropriate  Motor:  Normal  Speech/Language:   Clear and Coherent  Affect:  Appropriate  Mood:  dysthymic  Thought process:  normal  Thought content:    WNL  Sensory/Perceptual disturbances:    WNL  Orientation:  Fully oriented  Attention:  Good    Concentration:  Good  Memory:  WNL  Insight:    Good  Judgment:   Good  Impulse Control:  Fair   Risk Assessment: Danger to Self: No Self-injurious Behavior: No Danger to Others: No Physical Aggression / Violence: No Duty to Warn: No Access to Firearms a concern: No  Assessment of progress:  stabilized  Diagnosis:   ICD-10-CM   1. Moderate episode of recurrent major depressive disorder (HCC)  F33.1     2. Generalized anxiety disorder  F41.1     3. Attention deficit hyperactivity disorder (ADHD), unspecified ADHD type  F90.9     4. Sleep disturbances  G47.9     5. Polysubstance abuse (HCC) -- alcohol, marijuana  F19.10      Plan:  Focal effort today to put in a patch of time on living spaces in [place] and meaningful effort on dissertation Restrain alcohol, try to abstain from pot.  AA/NA available.  Continue to use delay strategy for urges to use, and self-reminder that today's reasonable sacrifices make gifts to tomorrow's self.   Keep working sleep hygiene measures, including turning loose of entertainment and tending needs for comfortable sleep environment Continue working on dissertation and related work as able, preferably in dedicated, Advertising account planner.  Use known organization tactics for work.  Option write thank-you note from the future for things he will have done to enable it. Use anti-resistance measures for dissertation -- take it to an office, fully express not wanting to, do paradoxical "work" (write badly, e.g.), "take it physical" (move while thinking), "take it social" (speak as to a friend, then write what's spoken) Still look to relocation to [place] as most likely way to get reengaged  in dissertation and career When contending with loved ones' needs and dependencies, try to draw out listening and rational decision making rather than feel responsible.  Always OK to make own decisions about refocusing, relocating, and letting them work with their own needs to learn better for themselves Other recommendations/advice as may be noted above Continue to utilize previously learned skills ad lib Maintain medication as prescribed and work faithfully with relevant prescriber(s) if any changes are desired or seem indicated Call the clinic on-call service, 988/hotline, 911, or present to [facility] or ER if any life-threatening psychiatric crisis Return for session(s) already scheduled. Already scheduled visit in this office 01/09/2022.  [name], PhD Tracy Czar, PhD LP Clinical Psychologist, [facility] [address] [place], [place] [address] [phone]

## 2022-01-07 ENCOUNTER — Telehealth (HOSPITAL_COMMUNITY): Payer: Self-pay | Admitting: *Deleted

## 2022-01-07 NOTE — Telephone Encounter (Signed)
PATIENT CALLED LVM REQUEST ED REFILL ::  amphetamine-dextroamphetamine  (ADDERALL XR) 30 MG 24 hr capsule 30 capsule  [facility] [address]

## 2022-01-08 ENCOUNTER — Other Ambulatory Visit (HOSPITAL_COMMUNITY): Payer: Self-pay | Admitting: Physician Assistant

## 2022-01-08 DIAGNOSIS — F909 Attention-deficit hyperactivity disorder, unspecified type: Secondary | ICD-10-CM

## 2022-01-08 MED ORDER — AMPHETAMINE-DEXTROAMPHET ER 30 MG PO CP24: 30.0000 mg | ORAL_CAPSULE | Freq: Every day | ORAL | 0 refills | Status: DC

## 2022-01-08 NOTE — Progress Notes (Signed)
Provider was contacted by Rushie Chestnut, RMA regarding patient's Adderall prescription refill.  Patient's medication to be e-prescribed to pharmacy of choice.

## 2022-01-08 NOTE — Telephone Encounter (Signed)
Provider was contacted by Tracy Robinson, RMA regarding refill on patient's medication. Patient's medication to be e-prescribed to pharmacy of choice.

## 2022-01-09 ENCOUNTER — Ambulatory Visit (INDEPENDENT_AMBULATORY_CARE_PROVIDER_SITE_OTHER): Payer: 59 | Admitting: Psychiatry

## 2022-01-09 DIAGNOSIS — F1910 Other psychoactive substance abuse, uncomplicated: Secondary | ICD-10-CM

## 2022-01-09 DIAGNOSIS — F331 Major depressive disorder, recurrent, moderate: Secondary | ICD-10-CM

## 2022-01-09 DIAGNOSIS — F411 Generalized anxiety disorder: Secondary | ICD-10-CM

## 2022-01-09 DIAGNOSIS — G479 Sleep disorder, unspecified: Secondary | ICD-10-CM

## 2022-01-09 DIAGNOSIS — F909 Attention-deficit hyperactivity disorder, unspecified type: Secondary | ICD-10-CM

## 2022-01-09 NOTE — Progress Notes (Signed)
Psychotherapy Progress Note [facility] Marliss Czar, PhD LP  Patient ID: Tracy Robinson [facility])    MRN: [id number] Therapy format: Individual psychotherapy Date: 01/09/2022      Start: 9:15a     Stop: 10:01a     Time Spent: 46 min Location: In-person   Session narrative (presenting needs, interim history, self-report of stressors and symptoms, applications of prior therapy, status changes, and interventions made in session) Did not make calls as planned last time, but did go through the saved apartments list and realized he just needs a firsthand look.  Booked a trip to [place] to go to a conference and take firsthand looks at places he might live.  Has also been going to a coffee shop and working honestly on dissertation stuff, making real progress.  Found that last week's notion of "going to [employer] house" (make like Iona Coach in the felix felicis scene from the 6th story/movie) was very freeing; suddenly better able to go in curious, rather than compelled and oppressed.  Today, has plans to hang out with friend Maddie till 2 then best option to get some dissertation time in at coffee shop, which is working well as a setting.  Reinforced the idea of "a little something" (for my environment, my future, my fun).  Also finding himself brushing teeth mid-evening, effectively checking off some night routine early, so that it's that much easier to just fall off to sleep when he's ready.    Realizing his morning motivation gets hijacked sometimes by being called into mother and her BF's day before he even gets his bearings.  Realization he may avoid leaving his room, actually, to prevent having to deal with TL and other things.  Encouraged to notice and use the opportunity to break out of being reactive and reclaim authority to enter the space, do the work, or change environments, not let being driven in or out be the sense he makes of it.  Considering reducing or maybe going off  trazodone entirely.  Psychiatry next week, will bring up.  Notes he has a routine he may resort to, started as a response to migraines, to lie down in a darkened shower and listen to [facility], works very well to help set a tine for sleep.    Notes when he gets anxious, it manifests particularly in getting short of breath.  Will also have esophageal spasms sometimes.  Has found that singing and dancing help him free up, and stretching sore muscles.  Affirmed and encouraged.  Therapeutic modalities: Cognitive Behavioral Therapy, Solution-Oriented/Positive Psychology, Ego-Supportive, and Narrative  Mental Status/Observations:  Appearance:   Casual     Behavior:  Appropriate  Motor:  Normal  Speech/Language:   Clear and Coherent  Affect:  Appropriate  Mood:  anxious  Thought process:  normal  Thought content:    WNL  Sensory/Perceptual disturbances:    WNL  Orientation:  Fully oriented  Attention:  Good    Concentration:  Good  Memory:  WNL  Insight:    Good  Judgment:   Good  Impulse Control:  Good   Risk Assessment: Danger to Self: No Self-injurious Behavior: No Danger to Others: No Physical Aggression / Violence: No Duty to Warn: No Access to Firearms a concern: No  Assessment of progress:  progressing  Diagnosis:   ICD-10-CM   1. Moderate episode of recurrent major depressive disorder (HCC)  F33.1     2. Generalized anxiety disorder  F41.1  3. Attention deficit hyperactivity disorder (ADHD), unspecified ADHD type  F90.9     4. Sleep disturbances  G47.9     5. Polysubstance abuse (HCC) -- alcohol, marijuana  F19.10      Plan:  Continue daily application of "a little something" for housing, dissertation, social connections, play for daily balance Use "[name]" / felix felicis mindset ad lib to activate when feeling stymied/paralyzed Restrain alcohol, try to abstain from pot.  AA/NA available.  Continue to use delay strategy for urges to use,  and self-reminder that today's reasonable sacrifices make gifts to tomorrow's self.   Keep working sleep hygiene measures, including turning loose of entertainment and tending needs for comfortable sleep environment.  Endorse shower w/ music, stretching, breathing tactics to bring down ANS arousal. Continue working on dissertation and related work as able, preferably in dedicated, Advertising account planner.  Use known organization tactics for work.  Option write thank-you note from the future for things he will have done to enable it. Use anti-resistance measures PRN for dissertation -- take it to an office, fully express not wanting to, do paradoxical "work" (write badly, e.g.), "take it physical" (move while thinking), "take it social" (speak as to a friend, then write what's spoken) Still look to relocation to [place] as most likely way to get reengaged in dissertation and career When contending with loved ones' needs and dependencies, try to draw out listening and rational decision making rather than feel responsible.  Always OK to make own decisions about refocusing, relocating, and letting them work with their own needs to learn better for themselves Other recommendations/advice as may be noted above Continue to utilize previously learned skills ad lib Maintain medication as prescribed and work faithfully with relevant prescriber(s) if any changes are desired or seem indicated Call the clinic on-call service, 988/hotline, 911, or present to [facility] or ER if any life-threatening psychiatric crisis Return for session(s) already scheduled. Already scheduled visit in this office 01/15/2022.  [name], PhD Marliss Czar, PhD LP Clinical Psychologist, [facility] [address], [address] [place], [place] [address] [phone]

## 2022-01-15 ENCOUNTER — Ambulatory Visit (INDEPENDENT_AMBULATORY_CARE_PROVIDER_SITE_OTHER): Payer: 59 | Admitting: Psychiatry

## 2022-01-15 DIAGNOSIS — F331 Major depressive disorder, recurrent, moderate: Secondary | ICD-10-CM | POA: Diagnosis not present

## 2022-01-15 DIAGNOSIS — F1910 Other psychoactive substance abuse, uncomplicated: Secondary | ICD-10-CM

## 2022-01-15 DIAGNOSIS — F411 Generalized anxiety disorder: Secondary | ICD-10-CM

## 2022-01-15 DIAGNOSIS — F909 Attention-deficit hyperactivity disorder, unspecified type: Secondary | ICD-10-CM | POA: Diagnosis not present

## 2022-01-15 DIAGNOSIS — G479 Sleep disorder, unspecified: Secondary | ICD-10-CM

## 2022-01-15 NOTE — Progress Notes (Signed)
Psychotherapy Progress Note [facility] Marliss Czar, PhD LP  Patient ID: Tracy Robinson [facility])    MRN: [id number] Therapy format: Individual psychotherapy Date: 01/15/2022      Start: 10:05a     Stop: 10:55a     Time Spent: 50 min Location: In-person   Session narrative (presenting needs, interim history, self-report of stressors and symptoms, applications of prior therapy, status changes, and interventions made in session) Up till 5am last night, quite tired this morning.  Wrote out a "what we're not going to talk about" note in the WR to get started.  New concern of sister Tracy Robinson being pregnant, risking heart attack in her condition, getting married, and moving into mother's house Aug 1.  Plans also made to visit [place] in 2 wks.  Now more clearly motivated to move back to [place].  Yesterday also reconnected to a second best friend Tracy Robinson, in [place], which was exhilarating.    Re. Tracy Robinson, first seen since 2015, when Tracy Robinson was deep into drugs, hoarding animals, and alienating male friends with chronically sexist comments.  Now he's engaged, cogent, sober, back into two favorite games, and great to reconnect.    Re. alcohol, was up drinking champagne last night, first drink in a week and a half, having gone through an 8-pack of tall boys, also petsitting.  The champagne was one of two left over from spoiled New Year's plans, figured may as well put it to use.  This morning worn out from sleep loss and somewhat from processing the alcohol.  Re. Tracy Robinson's situation, discussed the impact at more length, as it was undoubtedly a prompt for drinking (but also the reaching out and decision-making).  Very concerned she will risk a fatal heart attack if she carries to term, given her brittle diabetes, hx with her one prior delivery, and doctor's expressed concern.  Yearning to get her to make an informed decision about aborting, and framed message to [place], emphasizing  being willing to invite and hear out her own mixed feelings, express respect for her choice as long as he can satisfy himself she's making it eyes-open, and clarify that his own alarm is for seeing the risk of decisions that not only deprive her of her own life but deprive either 1 or 2 children of a mother, and their mother of a daughter, and possibly a fiancee of a wife.  Press if necessary for her to get unfiltered feedback from her own health care about the actual risks and what would actually happen if she tries to carry this pregnancy and fails, so all can be assured she isn't following illusions.  Validated the stressed feelings abut it all, and affirmed and encouraged that whatever happens, whatever his doubts are, he is going to "fall forward".    A is coming to town July 26, when he plans to be out of town.  Resolved he can make adjustments to do both, just a variable thrown into his plans.  Adds on the way out that he was given a vape and has been hitting nicotine pretty hard lately.  Making the decision it's just too tempting, will give it back.  Affirmed and encouraged.  Therapeutic modalities: Cognitive Behavioral Therapy, Solution-Oriented/Positive Psychology, Ego-Supportive, and Assertiveness/Communication  Mental Status/Observations:  Appearance:   Casual     Behavior:  Appropriate  Motor:  Normal  Speech/Language:   Clear and Coherent  Affect:  Appropriate  Mood:  weary  Thought process:  clear  Thought content:  WNL  Sensory/Perceptual disturbances:    WNL  Orientation:  Fully oriented  Attention:  Good    Concentration:  Good  Memory:  WNL  Insight:    Good  Judgment:   Good  Impulse Control:  Good   Risk Assessment: Danger to Self: No Self-injurious Behavior: No Danger to Others: No Physical Aggression / Violence: No Duty to Warn: No Access to Firearms a concern: No  Assessment of progress:  progressing  Diagnosis:   ICD-10-CM   1. Generalized anxiety  disorder  F41.1     2. Moderate episode of recurrent major depressive disorder (HCC)  F33.1     3. Polysubstance abuse (HCC) -- alcohol, marijuana, nicotine  F19.10     4. Attention deficit hyperactivity disorder (ADHD), unspecified ADHD type  F90.9     5. Sleep disturbances  G47.9      Plan:  Catch up sleep Follow through giving back vape, reestablishing alcohol control.  AA/NA available.  Continue to use delay strategy for urges to use Positive confrontation tips and heart to heart [name] contact with old friend Follow through with plans strategizing to relocate to [facility] as most likely way to get reengaged in dissertation and career.  Have needful conversations with family about how he worries about them and blessing to go work out his future. Motivational and antianxiety coping thoughts of "[name]" / felix felicis mindset to activate when feeling stymied/paralyzed, trust he can/will "fall forward" whatever twists and turns still come, and today's reasonable sacrifices are gifts to somebody he loves (either family member or tomorrow's self).  Continue daily application of "a little something" for housing, dissertation, social connections, play for daily balance. Continue working on dissertation and related work as able, preferably in dedicated, Advertising account planner.  Use known organization tactics for work.  Use anti-resistance measures PRN for dissertation -- take it to an office, fully express not wanting to, do paradoxical "work" (write badly, e.g.), "take it physical" (move while thinking), "take it social" (speak as to a friend, then write what's spoken).  Option write thank-you note from the future for things he will have done to enable it. When contending with loved ones' needs and dependencies, try to draw out listening and rational decision making rather than feel responsible to give, confront, or handle solutions for them.  Always OK to make own  decisions about refocusing, relocating, and letting them work with their own needs to learn better for themselves Other recommendations/advice as may be noted above Continue to utilize previously learned skills ad lib Maintain medication as prescribed and work faithfully with relevant prescriber(s) if any changes are desired or seem indicated Call the clinic on-call service, 988/hotline, 911, or present to [facility] or ER if any life-threatening psychiatric crisis Return for session(s) already scheduled. Already scheduled visit in this office 01/23/2022.  [name], PhD Marliss Czar, PhD LP Clinical Psychologist, [facility] [address] [place], [place] [address] [phone]

## 2022-01-17 ENCOUNTER — Telehealth (INDEPENDENT_AMBULATORY_CARE_PROVIDER_SITE_OTHER): Payer: 59 | Admitting: Psychiatry

## 2022-01-17 DIAGNOSIS — F331 Major depressive disorder, recurrent, moderate: Secondary | ICD-10-CM | POA: Diagnosis not present

## 2022-01-17 DIAGNOSIS — G479 Sleep disorder, unspecified: Secondary | ICD-10-CM

## 2022-01-17 DIAGNOSIS — F411 Generalized anxiety disorder: Secondary | ICD-10-CM

## 2022-01-17 MED ORDER — BUSPIRONE HCL 10 MG PO TABS: 10.0000 mg | ORAL_TABLET | Freq: Two times a day (BID) | ORAL | 0 refills | Status: DC

## 2022-01-17 MED ORDER — SERTRALINE HCL 100 MG PO TABS: 200.0000 mg | ORAL_TABLET | Freq: Every day | ORAL | 0 refills | Status: DC

## 2022-01-17 MED ORDER — TRAZODONE HCL 50 MG PO TABS: 50.0000 mg | ORAL_TABLET | Freq: Every day | ORAL | 0 refills | Status: DC

## 2022-01-17 NOTE — Progress Notes (Signed)
BH MD/PA/NP OP Progress Note  01/17/2022 11:01 AM Tracy [facility]  MRN:  [id number]  Virtual Visit via Video Note  I connected [name] on 01/17/22 at 11:00 AM EDT by a video enabled telemedicine application and verified that I am speaking with the correct person using two identifiers.  Location: Patient: home Provider: offsite   I discussed the limitations of evaluation and management by telemedicine and the availability of in person appointments. The patient expressed understanding and agreed to proceed.    I discussed the assessment and treatment plan with the patient. The patient was provided an opportunity to ask questions and all were answered. The patient agreed with the plan and demonstrated an understanding of the instructions.   The patient was advised to call back or seek an in-person evaluation if the symptoms worsen or if the condition fails to improve as anticipated.  I provided 5 minutes of non-face-to-face time during this encounter.   Tracy Sober, NP   Chief Complaint: Medication management  HPI: Patient is a  31 year-old male presenting [facility] for a follow up psychiatric evaluation. Patient has a psychiatric history of major depressive disorder, sleep disturbances, ADHD and generalized anxiety disorder.  Symptoms are managed with Adderall 30 mg daily, Zoloft 200 mg daily, trazodone 100 mg at bedtime and buspirone 10 mg twice daily.  Patient reports that medications are effective and denies adverse medication reactions.  Patient reports that he is sleeping well and desires to decrease trazodone dosage.  Patient reports medication compliance and requests medication refills today.   Patient is alert and oriented x 4, calm and willing to engage. Dressed appropriately for the weather and well groomed. Reports a good mood, sleep, and appetite. Denies SI/HI/AVH/paranoia.   Visit Diagnosis:    ICD-10-CM   1. Generalized anxiety  disorder  F41.1 busPIRone (BUSPAR) 10 MG tablet    sertraline (ZOLOFT) 100 MG tablet    2. Moderate episode of recurrent major depressive disorder (HCC)  F33.1 sertraline (ZOLOFT) 100 MG tablet    3. Sleep disturbances  G47.9 traZODone (DESYREL) 50 MG tablet      Past Psychiatric History: Generalized anxiety disorder, major depressive disorder, sleep disturbances and ADHD   Past Medical History: No past medical history on file. No past surgical history on file.  Family Psychiatric History: N/A  Family History: No family history on file.  Social History:  Social History   Socioeconomic History   Marital status: Single    Spouse name: Not on file   Number of children: Not on file   Years of education: Not on file   Highest education level: Not on file  Occupational History   Not on file  Tobacco Use   Smoking status: Never   Smokeless tobacco: Never  Substance and Sexual Activity   Alcohol use: Not on file   Drug use: Not on file   Sexual activity: Not on file  Other Topics Concern   Not on file  Social History Narrative   Not on file   Social Determinants of Health   Financial Resource Strain: Not on file  Food Insecurity: Not on file  Transportation Needs: Not on file  Physical Activity: Not on file  Stress: Not on file  Social Connections: Not on file    Allergies: No Known Allergies  Metabolic Disorder Labs: No results found for: "HGBA1C", "MPG" No results found for: "PROLACTIN" No results found for: "CHOL", "TRIG", "HDL", "CHOLHDL", "VLDL", "LDLCALC" No  results found for: "TSH"  Therapeutic Level Labs: No results found for: "LITHIUM" No results found for: "VALPROATE" No results found for: "CBMZ"  Current Medications: Current Outpatient Medications  Medication Sig Dispense Refill   amphetamine-dextroamphetamine (ADDERALL XR) 30 MG 24 hr capsule Take 1 capsule (30 mg total) by mouth daily. 30 capsule 0   busPIRone (BUSPAR) 10 MG tablet Take 1  tablet (10 mg total) by mouth 2 (two) times daily. 60 tablet 0   ondansetron (ZOFRAN) 4 MG tablet Take 1 tablet (4 mg total) by mouth daily as needed for nausea or vomiting. 30 tablet 0   sertraline (ZOLOFT) 100 MG tablet Take 2 tablets (200 mg total) by mouth daily. 60 tablet 0   traZODone (DESYREL) 50 MG tablet Take 1 tablet (50 mg total) by mouth at bedtime. 30 tablet 0   No current facility-administered medications for this visit.     Musculoskeletal: Strength & Muscle Tone: N/A virtual visit Gait & Station: N/A virtual visit Patient leans: N/A  Psychiatric Specialty Exam: Review of Systems  Psychiatric/Behavioral:  Negative for hallucinations, self-injury and suicidal ideas. The patient is not hyperactive.   All other systems reviewed and are negative.   There were no vitals taken for this visit.There is no height or weight on file to calculate BMI.  General Appearance: N/A  Eye Contact: N/A  Speech: Clear and coherent  Volume: Normal  Mood:  Euthymic  Affect:  Congruent  Thought Process:  Coherent  Orientation:  Full (Time, Place, and Person)  Thought Content: Logical   Suicidal Thoughts:  No  Homicidal Thoughts:  No  Memory: Good  Judgement: Good  Insight: Good  Psychomotor Activity: N/A  Concentration: Good  Recall: Good  Fund of Knowledge: Good  Language: Good  Akathisia: N/A  Handed: Right  AIMS (if indicated): Virtual visit  Assets:  Communication Skills  ADL's:  Intact  Cognition: WNL  Sleep:  Good   Screenings: GAD-7    Flowsheet Row Video Visit from 11/15/2021 in [facility] Video Visit from 09/13/2021 in [facility] Clinical Support from 07/27/2021 in [facility] Video Visit from 03/20/2021 in [facility] Office Visit from 01/23/2021 in [facility]  Total GAD-7 Score 19 19 19 19 18       PHQ2-9    Flowsheet Row  Video Visit from 11/15/2021 in [facility] Video Visit from 09/13/2021 in [facility] Clinical Support from 07/27/2021 in [facility] Video Visit from 03/20/2021 in [facility] Office Visit from 01/23/2021 in [facility]  PHQ-2 Total Score 3 4 4 6 6   PHQ-9 Total Score 16 16 18 22 20       Flowsheet Row Video Visit from 11/15/2021 in [facility] Video Visit from 09/13/2021 in [facility] Clinical Support from 07/27/2021 in [place] [facility]  C-SSRS RISK CATEGORY No Risk No Risk No Risk        Assessment and Plan: Patient is a  31 year-old male presenting to [facility] for a follow up psychiatric evaluation. Patient has a psychiatric history of major depressive disorder, sleep disturbances, ADHD and generalized anxiety disorder.  Symptoms are managed with Adderall 30 mg daily, Zoloft 200 mg daily, trazodone 100 mg at bedtime and buspirone 10 mg twice daily.  Patient reports that  medications are effective and denies adverse medication reactions.  Patient reports that he is sleeping well and desires to decrease trazodone dosage.  Patient reports medication compliance and requests medication refills today.  Trazodone decreased to 50 mg at bedtime as needed for sleep and Zoloft and buspirone refilled at current dosages.  Adderall not refilled today as it was previously refilled this month.  Collaboration of Care: Collaboration of Care: Medication Management AEB medications E scribed to patient's preferred pharmacy.  1. Generalized anxiety disorder  - busPIRone (BUSPAR) 10 MG tablet; Take 1 tablet (10 mg total) by mouth 2 (two) times daily.  Dispense: 60 tablet; Refill: 0 - sertraline (ZOLOFT) 100 MG tablet; Take 2 tablets (200 mg total) by mouth daily.   Dispense: 60 tablet; Refill: 0  2. Moderate episode of recurrent major depressive disorder (HCC)  - sertraline (ZOLOFT) 100 MG tablet; Take 2 tablets (200 mg total) by mouth daily.  Dispense: 60 tablet; Refill: 0  3. Sleep disturbances  - traZODone (DESYREL) 50 MG tablet; Take 1 tablet (50 mg total) by mouth at bedtime.  Dispense: 30 tablet; Refill: 0    Continue therapy Return to care in 2 months  Patient/Guardian was advised Release of Information must be obtained prior to any record release in order to collaborate their care with an outside provider. Patient/Guardian was advised if they have not already done so to contact the registration department to sign all necessary forms in order for [place] to release information regarding their care.   Consent: Patient/Guardian gives verbal consent for treatment and assignment of benefits for services provided during this visit. Patient/Guardian expressed understanding and agreed to proceed.    Tracy Sober, NP 01/17/2022, 11:01 AM

## 2022-01-23 ENCOUNTER — Ambulatory Visit (INDEPENDENT_AMBULATORY_CARE_PROVIDER_SITE_OTHER): Payer: 59 | Admitting: Psychiatry

## 2022-01-23 DIAGNOSIS — F909 Attention-deficit hyperactivity disorder, unspecified type: Secondary | ICD-10-CM | POA: Diagnosis not present

## 2022-01-23 DIAGNOSIS — F411 Generalized anxiety disorder: Secondary | ICD-10-CM

## 2022-01-23 DIAGNOSIS — G479 Sleep disorder, unspecified: Secondary | ICD-10-CM

## 2022-01-23 DIAGNOSIS — F331 Major depressive disorder, recurrent, moderate: Secondary | ICD-10-CM | POA: Diagnosis not present

## 2022-01-23 DIAGNOSIS — F1910 Other psychoactive substance abuse, uncomplicated: Secondary | ICD-10-CM

## 2022-01-23 NOTE — Progress Notes (Signed)
Psychotherapy Progress Note [facility] Marliss Czar, PhD LP  Patient ID: [facility])    MRN: [id number] Therapy format: Individual psychotherapy Date: 01/23/2022      Start: 10:07a     Stop: 10:56a     Time Spent: 49 min Location: In-person   Session narrative (presenting needs, interim history, self-report of stressors and symptoms, applications of prior therapy, status changes, and interventions made in session) [name] has declared she will have the baby, which is deeply worrisome given her heart condition.  Did get chance to talk with her some, has her acknowledgment that he is absolutely free on her account to go pursue his life and future and not shackle himself here for her sake.  Satisfied she is clear-eyed about her choice, sincerely willing to take the risks of carrying, delivering, and marrying, and if the worst happens while he is away, no regrets  Wants to cover more his fond memory of pGM, his "grandbuddy", who largely raised him.  Roxy Manns.  Has carried regret for not seeing more of her before she passed.  During pandemic, with valid fears of getting GM, S, or himself sick.  Had seen each other at Christmas, and he was in [place].  Honestly, home made him anxious, too.  Also had new kittens and didn't want to miss their youngness  Notes he is spiritually skeptic, but has been accompanying mother, and now sister, to church for 6 months.  Granted it is OK to be skeptical/questioning and still keep the image of cherished relatives as alive and well elsewhere, maybe even capable of witnessing and commenting into his life.  Re [place], is weighing the pros and cons.  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   Reviewed and encouraged further ahead of his trip later this month.  Therapeutic modalities: Cognitive Behavioral Therapy, Solution-Oriented/Positive Psychology, Environmental manager, and Motivational Interviewing  Mental Status/Observations:  Appearance:   Casual     Behavior:  Appropriate  Motor:  Normal  Speech/Language:   Clear and Coherent  Affect:  Appropriate  Mood:  More settled  Thought process:  normal  Thought content:    WNL  Sensory/Perceptual disturbances:    WNL  Orientation:  Fully oriented  Attention:  Good    Concentration:  Good  Memory:  WNL  Insight:    Good  Judgment:   Good  Impulse Control:  Good   Risk Assessment: Danger to Self: No Self-injurious Behavior: No Danger to Others: No Physical Aggression / Violence: No Duty to Warn: No Access to Firearms a concern: No  Assessment of progress:  progressing  Diagnosis:   ICD-10-CM   1. Generalized anxiety disorder  F41.1     2. Moderate episode of recurrent major depressive disorder (HCC)  F33.1     3. Attention deficit hyperactivity disorder (ADHD), unspecified ADHD type  F90.9     4. Sleep disturbances  G47.9     5. Polysubstance abuse (HCC) -- alcohol, marijuana, nicotine  F19.10      Plan:  Follow through on recon trip to [facility] of "grandbuddy's" abiding love for him and OK to imagine her in touch regardless of actual beliefs Encourage continuing accompanying family to church insofar as it  makes sense to him to do so Maintain vape abstinence, alcohol and pot moderation.  AA/NA available if need.  Continue to use delay and divert strategy for urges to use. Maintain circadian rhythm, adequate sleep and sleep hygiene Follow through with plans to relocate to [facility] as most likely way to get reengaged in dissertation and career.  Have needful conversations with family about how he worries about them and blessing to go work out his future. Motivational and antianxiety coping thoughts of  "[name]" / felix felicis mindset to activate when feeling stymied/paralyzed, trust he can/will "fall forward" whatever twists and turns still come, and today's reasonable sacrifices are gifts to somebody he loves (either family member or tomorrow's self).  Continue daily application of "a little something" for housing, dissertation, social connections, play for daily balance. Continue working on dissertation and related work as able, preferably in dedicated, Advertising account planner.  Use known organization tactics for work.  Use anti-resistance measures PRN for dissertation -- take it to an office, fully express not wanting to, do paradoxical "work" (write badly, e.g.), "take it physical" (move while thinking), "take it social" (speak as to a friend, then write what's spoken).  Option write thank-you note from the future for things he will have done to enable it. When contending with loved ones' needs and dependencies, try to draw out listening and rational decision making rather than feel responsible to give, confront, or handle solutions for them.  Always OK to make own decisions about refocusing, relocating, and letting them work with their own needs to learn better for themselves. Endorse contact with old friends where available Other recommendations/advice as may be noted above Continue to utilize previously learned skills ad lib Maintain medication as prescribed and work faithfully with relevant prescriber(s) if any changes are desired or seem indicated Call the clinic on-call service, 988/hotline, 911, or present to [facility] or ER if any life-threatening psychiatric crisis Return for time at discretion, pending travel changes. Already scheduled visit in this office 02/04/2022.  [name], PhD Marliss Czar, PhD LP Clinical Psychologist, [facility] [address] [place], [place] [address] [phone]

## 2022-01-28 ENCOUNTER — Other Ambulatory Visit (HOSPITAL_COMMUNITY): Payer: Self-pay | Admitting: Psychiatry

## 2022-01-28 ENCOUNTER — Telehealth (HOSPITAL_COMMUNITY): Payer: Self-pay | Admitting: *Deleted

## 2022-01-28 DIAGNOSIS — F331 Major depressive disorder, recurrent, moderate: Secondary | ICD-10-CM

## 2022-01-28 DIAGNOSIS — G479 Sleep disorder, unspecified: Secondary | ICD-10-CM

## 2022-01-28 DIAGNOSIS — F411 Generalized anxiety disorder: Secondary | ICD-10-CM

## 2022-01-28 MED ORDER — TRAZODONE HCL 50 MG PO TABS: 50.0000 mg | ORAL_TABLET | Freq: Every day | ORAL | 3 refills | Status: DC

## 2022-01-28 MED ORDER — BUSPIRONE HCL 10 MG PO TABS: 10.0000 mg | ORAL_TABLET | Freq: Two times a day (BID) | ORAL | 3 refills | Status: DC

## 2022-01-28 MED ORDER — SERTRALINE HCL 100 MG PO TABS: 200.0000 mg | ORAL_TABLET | Freq: Every day | ORAL | 3 refills | Status: DC

## 2022-01-28 NOTE — Telephone Encounter (Signed)
Patient of [facility] Sending Requests to Dr Burtis Junes  Rx Refill Request :  sertraline (ZOLOFT) 100 MG tablet Take 2 tablets (200 mg total) by mouth daily

## 2022-01-28 NOTE — Telephone Encounter (Signed)
Medication refilled and sent to preferred pharmacy

## 2022-02-04 ENCOUNTER — Ambulatory Visit: Payer: 59 | Admitting: Psychiatry

## 2022-02-27 ENCOUNTER — Ambulatory Visit (INDEPENDENT_AMBULATORY_CARE_PROVIDER_SITE_OTHER): Payer: 59 | Admitting: Psychiatry

## 2022-02-27 DIAGNOSIS — F331 Major depressive disorder, recurrent, moderate: Secondary | ICD-10-CM

## 2022-02-27 DIAGNOSIS — F411 Generalized anxiety disorder: Secondary | ICD-10-CM

## 2022-02-27 DIAGNOSIS — F1910 Other psychoactive substance abuse, uncomplicated: Secondary | ICD-10-CM | POA: Diagnosis not present

## 2022-02-27 DIAGNOSIS — F909 Attention-deficit hyperactivity disorder, unspecified type: Secondary | ICD-10-CM

## 2022-02-27 NOTE — Progress Notes (Signed)
Psychotherapy Progress Note [facility] 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  Notably, found himself more comfortable with not just knowing how everything would work, and permission to figure it out.  Still anxious about leaving family, with [name] pregnant and at elevated risk of heart attack, and recent scare with her health.  On the whole, does trust the people who will remain (mom, TL, Christina's BF Hunter) enough to be of sufficient help to her, and believes it is OK for him to come off duty in his own mind as the one, competent, local, emergency backup, and go pursue his life.  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  Discussed relapse prevention.  Was very temperate with substances while away, helping to illustrate how much they have been urgent antidepressants and self-medication for anxiety.  Finding that he feels dismal again almost on arrival coming home.  Discussed classical conditioning effects and coached in noticing and making other choices to cope with emotional conditioning.    Renewed quandary whether to get in touch with [name], for closure's sake.  Probed what closure he figures she would need vs himself, resolved that it's OK to let her know of hs plans, just be sure not to expect results, response,  or her wanting to pursue.  Found someone in [place] he likes a lot.  [place], from [place].  She can be downright intoxicating.  Discussed outlook for relationship and importance of managing expectations and distractions attached to dating, should it get that far.  Clear that he wants to make this coming academic year a successful focus  Looking at work for while he is in [place].  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  This would be distinct from prostitution, and feels he knows the environment well enough to not be at risk for assault even if he would be willingly an object.  Plan now is to move September 1.  Feeling wistful about the journey he's been on in therapy, notes fondness for [place] and hope to carry forward the benefits of our acquaintance.  Therapeutic modalities: Cognitive Behavioral Therapy, Solution-Oriented/Positive Psychology, Environmental manager, and Motivational Interviewing  Mental Status/Observations:  Appearance:   Casual     Behavior:  Appropriate  Motor:  Normal  Speech/Language:   Clear and Coherent  Affect:  Appropriate  Mood:  More positive, some anxiety  Thought process:  normal  Thought content:    WNL  Sensory/Perceptual disturbances:    WNL  Orientation:  Fully oriented  Attention:  Good    Concentration:  Good  Memory:  WNL  Insight:    Good  Judgment:   Good  Impulse Control:  Good  Risk Assessment: Danger to Self: No Self-injurious Behavior: No Danger to Others: No Physical Aggression / Violence: No Duty to Warn: No Access to Firearms a concern: No  Assessment of progress:  progressing  Diagnosis:   ICD-10-CM   1. Generalized anxiety disorder  F41.1     2. Moderate episode of recurrent major depressive disorder (HCC)  F33.1     3. Polysubstance abuse (HCC) -- alcohol, marijuana, nicotine  F19.10      4. Attention deficit hyperactivity disorder (ADHD), unspecified ADHD type  F90.9      Plan:  Maintain vape abstinence, alcohol and pot moderation.  AA/NA available if need.  Continue to use delay and divert strategy for urges to use. Maintain circadian rhythm, adequate sleep and sleep hygiene Follow through with plans to relocate to [facility] as most likely way to get reengaged in dissertation and career.  Have needful conversations with family as still needed for closure and blessing. Continue working on dissertation and related work as able, preferably in dedicated, Advertising account planner.  Use known organization tactics for work.  Use anti-resistance measures PRN for dissertation -- take it to an office, fully express not wanting to, do paradoxical "work" (write badly, e.g.), "take it physical" (move while thinking), "take it social" (speak as to a friend, then write what's spoken).  Option write thank-you note from the future for things he will have done to enable it. For general motivational and antianxiety coping --  "[name]" / felix felicis mindset to activate when feeling stymied/paralyzed trust he can/will "fall forward" whatever twists and turns still come today's reasonable sacrifices are gifts to somebody he loves (either family member or tomorrow's self) daily application of "a little something" for living situation, dissertation, social connections, and play self-assure of "grandbuddy's" abiding love for him and OK to imagine her in touch regardless of actual beliefs When contending with loved ones' needs and dependencies, try to draw out listening and rational decision making rather than feel responsible to give, confront, or handle solutions for them.  Always OK to make own decisions about refocusing, relocating, and letting them work with their own needs to learn better for themselves. Endorse contact with old friends where available Option to contact [name] -- ensure  no expectations, OK with response or nonresponse, either one Go forward mindful of the power of new interest to be the next derailing distraction working through his qualifications for doctorate Other recommendations/advice as may be noted above Continue to utilize previously learned skills ad lib Maintain medication as prescribed and work faithfully with relevant prescriber(s) if any changes are desired or seem indicated Call the clinic on-call service, 988/hotline, 911, or present to [facility] or ER if any life-threatening psychiatric crisis Return for session(s) already scheduled. Already scheduled visit in this office 03/06/2022.  [name], PhD Marliss Czar, PhD LP Clinical Psychologist, [facility] [address], [address] [place], [place] [address] [phone]

## 2022-03-01 ENCOUNTER — Telehealth (HOSPITAL_COMMUNITY): Payer: Self-pay | Admitting: Physician Assistant

## 2022-03-01 ENCOUNTER — Telehealth (HOSPITAL_COMMUNITY): Payer: Self-pay | Admitting: *Deleted

## 2022-03-01 NOTE — Telephone Encounter (Signed)
Patient called for Adderall Refill -- informed last seen 6/9 & NO future APPT's scheduled  And that a appt is needed

## 2022-03-06 ENCOUNTER — Ambulatory Visit (INDEPENDENT_AMBULATORY_CARE_PROVIDER_SITE_OTHER): Payer: 59 | Admitting: Psychiatry

## 2022-03-06 DIAGNOSIS — F909 Attention-deficit hyperactivity disorder, unspecified type: Secondary | ICD-10-CM | POA: Diagnosis not present

## 2022-03-06 DIAGNOSIS — F331 Major depressive disorder, recurrent, moderate: Secondary | ICD-10-CM | POA: Diagnosis not present

## 2022-03-06 DIAGNOSIS — F411 Generalized anxiety disorder: Secondary | ICD-10-CM

## 2022-03-06 DIAGNOSIS — Z638 Other specified problems related to primary support group: Secondary | ICD-10-CM

## 2022-03-06 DIAGNOSIS — F1910 Other psychoactive substance abuse, uncomplicated: Secondary | ICD-10-CM

## 2022-03-06 DIAGNOSIS — G479 Sleep disorder, unspecified: Secondary | ICD-10-CM

## 2022-03-06 NOTE — Progress Notes (Signed)
Psychotherapy Progress Note [facility] Marliss Czar, PhD LP  Patient ID: Tracy Robinson [facility])    MRN: [id number] Therapy format: Individual psychotherapy Date: 03/06/2022      Start: 9:15a     Stop: 10:00a     Time Spent: 45 min Location: In-person   Session narrative (presenting needs, interim history, self-report of stressors and symptoms, applications of prior therapy, status changes, and interventions made in session) Not so productive last week, but surprised to get approved for the apartment, which is a good deal for space, cost, pet fee, availability.  More galvanized to follow through on the [place] plan now.  Actively working on Data processing manager, still plans for Sep 1 to be there.  Proud and happy for self, sees success setting up his world to work for him, not have to be coped with so much.  Resolved not to contact [name] about it, has it well enough that she has ended the relationship and if she wants to revive acquaintance, she will.  New anxiety about his Twitter account getting hacked, working it out.  Has taken care of replacement insurance, given that his plan is ending early at end of the month.    Reviewed errands -- will need to contact psychiatrist for Adderall refill, needs to plan for events in Oct-Dec (2 weddings and a conference).  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  Has scheduled a lot of time with friends ahead of departure.  May reach out briefly to F's ex-GF (the favorite, Sheppard Penton, who messaged him after F died).  Interested in transitioning to more trauma-focused therapy in [place], although he knows he should get logistics, lifestyle, and momentum going there first.  Discussed how to search for some things he has enjoyed and found useful here.  Therapeutic modalities: Cognitive Behavioral Therapy and Solution-Oriented/Positive  Psychology  Mental Status/Observations:  Appearance:   Casual     Behavior:  Appropriate  Motor:  Normal  Speech/Language:   Clear and Coherent  Affect:  Appropriate  Mood:  anxious and more positive  Thought process:  normal  Thought content:    WNL  Sensory/Perceptual disturbances:    WNL  Orientation:  Fully oriented  Attention:  Good    Concentration:  Good  Memory:  WNL  Insight:    Good  Judgment:   Good  Impulse Control:  Good   Risk Assessment: Danger to Self: No Self-injurious Behavior: No Danger to Others: No Physical Aggression / Violence: No Duty to Warn: No Access to Firearms a concern: No  Assessment of progress:  progressing  Diagnosis:   ICD-10-CM   1. Generalized anxiety disorder  F41.1     2. Attention deficit hyperactivity disorder (ADHD), unspecified ADHD type  F90.9     3. Moderate episode of recurrent major depressive disorder (HCC)  F33.1     4. Polysubstance abuse (HCC) -- alcohol, marijuana, nicotine  F19.10     5. Sleep disturbances  G47.9     6. Relationship problem with family members  Z63.8      Plan:  Substances Maintain vape abstinence, alcohol and pot abstain or moderate AA/NA available if need Continue to use delay and divert strategy for urges to use. Physiological self-care Maintain circadian rhythm adequate sleep and sleep hygiene Relocation and dissertation considerations  Follow through with plans for [facility] as most likely way  to get reengaged in dissertation and career.   Have needful conversations with family as still needed for closure and blessing. Watch for new relationship interest to be the next derailing distraction and manage Work preferably in dedicated, Scientist, research (life sciences) and anti-resistance measures -- take it to an office, break down tasks, express aloud not wanting to, paradoxical "work"/write badly, "take it physical" (move while thinking), "take it social" (speak as  to a friend, then write what's spoken).  Option write thank-you note from the future for things he will have done to enable it. For general motivational and antianxiety coping --  "[name]" / felix felicis mindset to activate when feeling stymied/paralyzed trust he can/will "fall forward" whatever twists and turns still come today's reasonable sacrifices are gifts to somebody he loves (family member or tomorrow's self) daily application of "a little something" for living situation, dissertation, social connections, and play self-assure of "grandbuddy's" abiding love, may imagine her in touch regardless of actual spiritual beliefs Family obligation/stress  Self-affirm that [name] and mother both endorse going away and reclaiming life/future.   When contending with loved ones' needs and dependencies, try to draw out listening and rational decision making rather than feel responsible to give, confront, or handle solutions for them.   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 Already scheduled visit in this office 03/13/2022.  [name], PhD Marliss Czar, PhD LP Clinical Psychologist, [facility] [address] [place], [place] [address] [phone]

## 2022-03-13 ENCOUNTER — Ambulatory Visit (INDEPENDENT_AMBULATORY_CARE_PROVIDER_SITE_OTHER): Payer: 59 | Admitting: Psychiatry

## 2022-03-13 DIAGNOSIS — F331 Major depressive disorder, recurrent, moderate: Secondary | ICD-10-CM | POA: Diagnosis not present

## 2022-03-13 DIAGNOSIS — F411 Generalized anxiety disorder: Secondary | ICD-10-CM

## 2022-03-13 DIAGNOSIS — Z638 Other specified problems related to primary support group: Secondary | ICD-10-CM

## 2022-03-13 DIAGNOSIS — F1910 Other psychoactive substance abuse, uncomplicated: Secondary | ICD-10-CM

## 2022-03-13 DIAGNOSIS — F909 Attention-deficit hyperactivity disorder, unspecified type: Secondary | ICD-10-CM | POA: Diagnosis not present

## 2022-03-13 DIAGNOSIS — G479 Sleep disorder, unspecified: Secondary | ICD-10-CM

## 2022-03-13 NOTE — Progress Notes (Signed)
Psychotherapy Progress Note [facility] Marliss Czar, PhD LP  Patient ID: Tracy Robinson [facility])    MRN: [id number] Therapy format: Individual psychotherapy Date: 03/13/2022      Start: 8:12a     Stop: 9:02a     Time Spent: 50 min Location: In-person   Session narrative (presenting needs, interim history, self-report of stressors and symptoms, applications of prior therapy, status changes, and interventions made in session) Admin tasks right now to withdraw some money from his inheritance account, both to cover current costs and to qualify for [employer].  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  Rethinking whether to take his Magic and trading cards.  Reflected back to him, resolved to leave them, hard as that is.  Reminded he can fetch them on another trip if badly desired, but important to start more [place] than that to get dissertation work rhythm going.  Resolved to revise his public speaking course syllabus.    Compiled a list of freeing things he can do for times of paralysis, irritability, or depression -- includes walking, singing, petting Stump, apply "the vegetable rule" (feel like a vegetable, get what vegetables need), cursing into a balloon until full, fantasize walking home from [place], say it aloud when he doesn't want to do something, and other items noted.   Therapeutic modalities: Cognitive Behavioral Therapy, Solution-Oriented/Positive Psychology, Ego-Supportive, Humanistic/Existential, and Motivational Interviewing  Mental Status/Observations:  Appearance:   Casual     Behavior:  Appropriate  Motor:  Normal  Speech/Language:   Clear and Coherent  Affect:  Appropriate  Mood:  More apprehensive  Thought process:  normal  Thought content:    WNL  Sensory/Perceptual disturbances:    WNL  Orientation:  Fully oriented  Attention:  Good    Concentration:  Good   Memory:  WNL  Insight:    Good  Judgment:   Good  Impulse Control:  Good   Risk Assessment: Danger to Self: No Self-injurious Behavior: No Danger to Others: No Physical Aggression / Violence: No Duty to Warn: No Access to Firearms a concern: No  Assessment of progress:  progressing  Diagnosis:   ICD-10-CM   1. Generalized anxiety disorder  F41.1     2. Attention deficit hyperactivity disorder (ADHD), unspecified ADHD type  F90.9     3. Moderate episode of recurrent major depressive disorder (HCC)  F33.1     4. Polysubstance abuse (HCC) -- alcohol, marijuana, nicotine  F19.10     5. Sleep disturbances  G47.9     6. Relationship problem with family members  Z63.8      Plan:  Substances Maintain vape abstinence, alcohol and pot abstain or moderate AA/NA available if need Continue to use delay and divert strategy for urges to use. 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  Resolved end of September widen search to nonacademic roles (go-go dance, office temp, etc.) Conversations with family as still needed for closure and blessing Watch for new relationship interest to be the next derailing distraction and manage Work preferably in dedicated, low-distraction environment Use known organization tactics and anti-resistance measures -- take it to an office, break down tasks, express aloud not wanting to, paradoxical "work"/write badly, "take it physical" (move while thinking), "take it social" (speak as to a friend, then write what's spoken).  Option write thank-you note from the future for things he will have done to enable it. Keep list of freeing things he can do for times of paralysis, irritability, or depression -- includes walking, singing, petting Stump, apply "the vegetable rule" (feel like a vegetable, get  what vegetables need), cursing into a balloon until full, fantasize walking home from [place], say it aloud when he doesn't want to do something For general motivational and antianxiety coping --  "[name]" / felix felicis mindset to activate when feeling stymied/paralyzed trust he can/will "fall forward" whatever twists and turns still come today's reasonable sacrifices are gifts to somebody he loves (family member or tomorrow's self) daily application of "a little something" for living situation, dissertation, social connections, and play self-assure of "grandbuddy's" abiding love, may imagine her in touch regardless of actual spiritual beliefs Family obligation/stress  Self-affirm that [name] and mother both endorse going away and reclaiming life/future.   When contending with loved ones' needs and dependencies, try to draw out listening and rational decision making rather than feel responsible to give, confront, or handle solutions for them.   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 Already scheduled visit in this office 03/20/2022.  [name], PhD Marliss Czar, PhD LP Clinical Psychologist, [facility] [address] [place], [place] [address] [phone]

## 2022-03-14 ENCOUNTER — Encounter (HOSPITAL_COMMUNITY): Payer: Self-pay | Admitting: Physician Assistant

## 2022-03-14 ENCOUNTER — Ambulatory Visit (INDEPENDENT_AMBULATORY_CARE_PROVIDER_SITE_OTHER): Payer: 59 | Admitting: Physician Assistant

## 2022-03-14 DIAGNOSIS — F411 Generalized anxiety disorder: Secondary | ICD-10-CM | POA: Diagnosis not present

## 2022-03-14 DIAGNOSIS — G479 Sleep disorder, unspecified: Secondary | ICD-10-CM | POA: Diagnosis not present

## 2022-03-14 DIAGNOSIS — F909 Attention-deficit hyperactivity disorder, unspecified type: Secondary | ICD-10-CM | POA: Diagnosis not present

## 2022-03-14 DIAGNOSIS — F331 Major depressive disorder, recurrent, moderate: Secondary | ICD-10-CM

## 2022-03-14 MED ORDER — AMPHETAMINE-DEXTROAMPHET ER 30 MG PO CP24: 30.0000 mg | ORAL_CAPSULE | Freq: Every day | ORAL | 0 refills | Status: DC

## 2022-03-14 MED ORDER — SERTRALINE HCL 100 MG PO TABS: 200.0000 mg | ORAL_TABLET | Freq: Every day | ORAL | 0 refills | Status: DC

## 2022-03-14 MED ORDER — TRAZODONE HCL 50 MG PO TABS: 50.0000 mg | ORAL_TABLET | Freq: Every day | ORAL | 0 refills | Status: DC

## 2022-03-14 MED ORDER — BUSPIRONE HCL 10 MG PO TABS: 10.0000 mg | ORAL_TABLET | Freq: Two times a day (BID) | ORAL | 0 refills | Status: DC

## 2022-03-14 MED ORDER — AMPHETAMINE-DEXTROAMPHET ER 30 MG PO CP24
30.0000 mg | ORAL_CAPSULE | Freq: Every day | ORAL | 0 refills | Status: DC
Start: 2022-03-14 — End: 2023-05-22

## 2022-03-14 NOTE — Progress Notes (Signed)
BH MD/PA/NP OP Progress Note  03/14/2022 8:31 AM Tracy Robinson  MRN:  [id number]  Chief Complaint:  No chief complaint on file.  HPI:   Tracy Robinson is a 31 year old male with a past psychiatric history significant for generalized anxiety disorder, major depressive disorder, sleep disturbances, and attention deficit hyperactivity disorder who presents to [facility] via virtual video visit for follow-up and medication management.  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  Patient is aware that he will no longer be able to be seen by this provider once he moves out of the [facility] area.  Provider discussed with patient that he would be provided a 52-month supply of his medications to give him enough time to establish care with another psychiatric provider in [place].  Patient vocalized understanding.  Patient reports that his medications continue to go well.  He reports that he still experiences occasional depression citing that he experiences depressive episodes roughly 2-3 times per week.  Patient's depressive episodes are characterized by the following symptoms: fatigue, this interest in activities, and trouble thinking clearly.  Patient endorses anxiety every day and states that he may experience feeling episodes once or twice a week.  Patient rates his anxiety at 5 out of 10.  Besides moving soon, other source of stress for the patient is the fact that his sister is going through a dangerous pregnancy.  PHQ-9 screen was performed with the patient scoring a 10.  A GAD-7 screen was also performed with the patient scoring a 14.  Patient is alert and oriented x4, pleasant, calm, cooperative, and fully engaged in conversation during the  encounter.  Patient endorses chill mood.  Patient denies suicidal or homicidal ideation.  He further denies auditory or visual hallucinations and does not appear to be responding to internal/external stimuli.  Patient endorses good sleep and receives on average 7-1/2 hours of sleep each night.  Patient endorses good appetite and eats on average 2 meals a day as well as a lot of snacking.  Patient endorses alcohol consumption on occasion.  Patient denies tobacco use.  Patient endorses illicit drug use in the form of marijuana.  Visit Diagnosis:    ICD-10-CM   1. Generalized anxiety disorder  F41.1 sertraline (ZOLOFT) 100 MG tablet    busPIRone (BUSPAR) 10 MG tablet    2. Moderate episode of recurrent major depressive disorder (HCC)  F33.1 sertraline (ZOLOFT) 100 MG tablet    3. Sleep disturbances  G47.9 traZODone (DESYREL) 50 MG tablet    4. Attention deficit hyperactivity disorder (ADHD), unspecified ADHD type  F90.9 amphetamine-dextroamphetamine (ADDERALL XR) 30 MG 24 hr capsule    amphetamine-dextroamphetamine (ADDERALL XR) 30 MG 24 hr capsule    amphetamine-dextroamphetamine (ADDERALL XR) 30 MG 24 hr capsule      Past Psychiatric History:  Major depressive disorder Generalized anxiety disorder ADHD Sleep disturbances  Past Medical History: History reviewed. No pertinent past medical history. History reviewed. No pertinent surgical history.  Family Psychiatric History:  Father (deceased) - Alcoholism Sister - mood disorder. Patient states that his sister has also struggled with anorexia and bulimia  Family History: History reviewed. No pertinent family history.  Social History:  Social History   Socioeconomic History   Marital status: Single  Spouse name: Not on file   Number of children: Not on file   Years of education: Not on file   Highest education level: Not on file  Occupational History   Not on file  Tobacco Use   Smoking status: Never   Smokeless tobacco:  Never  Substance and Sexual Activity   Alcohol use: Not on file   Drug use: Not on file   Sexual activity: Not on file  Other Topics Concern   Not on file  Social History Narrative   Not on file   Social Determinants of Health   Financial Resource Strain: Not on file  Food Insecurity: Not on file  Transportation Needs: Not on file  Physical Activity: Not on file  Stress: Not on file  Social Connections: Not on file    Allergies: No Known Allergies  Metabolic Disorder Labs: No results found for: "HGBA1C", "MPG" No results found for: "PROLACTIN" No results found for: "CHOL", "TRIG", "HDL", "CHOLHDL", "VLDL", "LDLCALC" No results found for: "TSH"  Therapeutic Level Labs: No results found for: "LITHIUM" No results found for: "VALPROATE" No results found for: "CBMZ"  Current Medications: Current Outpatient Medications  Medication Sig Dispense Refill   [START ON 04/13/2022] amphetamine-dextroamphetamine (ADDERALL XR) 30 MG 24 hr capsule Take 1 capsule (30 mg total) by mouth daily. 30 capsule 0   [START ON 05/13/2022] amphetamine-dextroamphetamine (ADDERALL XR) 30 MG 24 hr capsule Take 1 capsule (30 mg total) by mouth daily. 30 capsule 0   amphetamine-dextroamphetamine (ADDERALL XR) 30 MG 24 hr capsule Take 1 capsule (30 mg total) by mouth daily. 30 capsule 0   busPIRone (BUSPAR) 10 MG tablet Take 1 tablet (10 mg total) by mouth 2 (two) times daily. 180 tablet 0   ondansetron (ZOFRAN) 4 MG tablet Take 1 tablet (4 mg total) by mouth daily as needed for nausea or vomiting. 30 tablet 0   sertraline (ZOLOFT) 100 MG tablet Take 2 tablets (200 mg total) by mouth daily. 180 tablet 0   traZODone (DESYREL) 50 MG tablet Take 1 tablet (50 mg total) by mouth at bedtime. 90 tablet 0   No current facility-administered medications for this visit.     Musculoskeletal: Strength & Muscle Tone: Unable to assess due to telemedicine visit Gait & Station: Unable to assess due to telemedicine  visit Patient leans: Unable to assess due to telemedicine visit  Psychiatric Specialty Exam: Review of Systems  Psychiatric/Behavioral:  Negative for decreased concentration, dysphoric mood, hallucinations, self-injury, sleep disturbance and suicidal ideas. The patient is nervous/anxious. The patient is not hyperactive.     Blood pressure 113/78, pulse 66, height 5\' 9"  (1.753 m), weight 164 lb (74.4 kg), SpO2 100 %.Body mass index is 24.22 kg/m.  General Appearance: Unable to assess due to telemedicine visit  Eye Contact:  Unable to assess due to telemedicine visit  Speech:  Clear and Coherent and Normal Rate  Volume:  Normal  Mood:  Anxious and Depressed  Affect:  Congruent  Thought Process:  Coherent, Goal Directed, and Descriptions of Associations: Intact  Orientation:  Full (Time, Place, and Person)  Thought Content: WDL   Suicidal Thoughts:  No  Homicidal Thoughts:  No  Memory:  Immediate;   Good Recent;   Good Remote;   Good  Judgement:  Good  Insight:  Good  Psychomotor Activity:  Normal  Concentration:  Concentration: Good and Attention Span: Good  Recall:  Good  Fund of Knowledge: Good  Language: Good  Akathisia:  No  Handed:  Right  AIMS (if indicated): not done  Assets:  Communication Skills Desire for Improvement Housing Social Support Vocational/Educational  ADL's:  Intact  Cognition: WNL  Sleep:  Good   Screenings: GAD-7    Flowsheet Row Office Visit from 03/14/2022 in [facility] Video Visit from 11/15/2021 in [facility] Video Visit from 09/13/2021 in [facility] Clinical Support from 07/27/2021 in [facility] Video Visit from 03/20/2021 in [facility]  Total GAD-7 Score 14 19 19 19 19       PHQ2-9    Flowsheet Row Office Visit from 03/14/2022 in [facility] Video Visit from 11/15/2021  in [facility] Video Visit from 09/13/2021 in [facility] Clinical Support from 07/27/2021 in [facility] Video Visit from 03/20/2021 in [facility]  PHQ-2 Total Score 3 3 4 4 6   PHQ-9 Total Score 10 16 16 18 22       Flowsheet Row Office Visit from 03/14/2022 in [facility] Video Visit from 11/15/2021 in [facility] Video Visit from 09/13/2021 in [facility]  C-SSRS RISK CATEGORY No Risk No Risk No Risk        Assessment and Plan:   Tracy Robinson is a 31 year old male with a past psychiatric history significant for generalized anxiety disorder, major depressive disorder, sleep disturbances, and attention deficit hyperactivity disorder who presents to [facility] for follow-up and medication management.  Patient reports no issues or concerns regarding his current medication regimen.  Although patient continues to experience depression and anxiety on occasion, patient is comfortable with continuing his current medication regimen.  Patient appears stable at the time of the encounter and will be provided a 34-month supply of his medications due to moving to [place].  Patient's medications to be e-prescribed to pharmacy of choice.  Collaboration of Care: Collaboration of Care: Medication Management AEB provider is currently managing patient's psychiatric medications, Psychiatrist AEB patient is currently being seen by a mental health provider, and Referral or follow-up with counselor/therapist AEB patient being seen by a psychologist [facility], PhD)  Patient/Guardian was advised Release of Information must be obtained prior to any record release in order to collaborate their care with an outside provider. Patient/Guardian was advised if they have not already done so to  contact the registration department to sign all necessary forms in order for 2-month to release information regarding their care.   Consent: Patient/Guardian gives verbal consent for treatment and assignment of benefits for services provided during this visit. Patient/Guardian expressed understanding and agreed to proceed.   1. Generalized anxiety disorder  - sertraline (ZOLOFT) 100 MG tablet; Take 2 tablets (200 mg total) by mouth daily.  Dispense: 180 tablet; Refill: 0 - busPIRone (BUSPAR) 10 MG tablet; Take 1 tablet (10 mg total) by mouth 2 (two) times daily.  Dispense: 180 tablet; Refill: 0  2. Moderate episode of recurrent major depressive disorder (HCC)  - sertraline (ZOLOFT) 100 MG tablet; Take 2 tablets (200 mg total) by mouth daily.  Dispense: 180 tablet; Refill: 0  3. Sleep disturbances  - traZODone (DESYREL) 50 MG tablet; Take 1 tablet (50 mg total) by mouth at bedtime.  Dispense: 90 tablet; Refill: 0  4. Attention deficit hyperactivity disorder (ADHD), unspecified ADHD type  - amphetamine-dextroamphetamine (ADDERALL XR) 30  MG 24 hr capsule; Take 1 capsule (30 mg total) by mouth daily.  Dispense: 30 capsule; Refill: 0 - amphetamine-dextroamphetamine (ADDERALL XR) 30 MG 24 hr capsule; Take 1 capsule (30 mg total) by mouth daily.  Dispense: 30 capsule; Refill: 0 - amphetamine-dextroamphetamine (ADDERALL XR) 30 MG 24 hr capsule; Take 1 capsule (30 mg total) by mouth daily.  Dispense: 30 capsule; Refill: 0  Due to patient moving to [place], he will no longer be a patient to the practice.  Patient to be provided a 7-month supply of his medications following discharge from the practice. Provider spent a total of 11 minutes with the patient/reviewing patient's chart  Meta Hatchet, PA 03/14/2022 8:31 AM

## 2022-03-20 ENCOUNTER — Ambulatory Visit (INDEPENDENT_AMBULATORY_CARE_PROVIDER_SITE_OTHER): Payer: 59 | Admitting: Psychiatry

## 2022-03-20 DIAGNOSIS — F331 Major depressive disorder, recurrent, moderate: Secondary | ICD-10-CM

## 2022-03-20 DIAGNOSIS — F909 Attention-deficit hyperactivity disorder, unspecified type: Secondary | ICD-10-CM | POA: Diagnosis not present

## 2022-03-20 DIAGNOSIS — F411 Generalized anxiety disorder: Secondary | ICD-10-CM

## 2022-03-20 DIAGNOSIS — F1910 Other psychoactive substance abuse, uncomplicated: Secondary | ICD-10-CM | POA: Diagnosis not present

## 2022-03-20 NOTE — Progress Notes (Signed)
Psychotherapy Progress Note [facility] Tracy Czar, PhD LP  Patient ID: Tracy Robinson [facility])    MRN: [id number] Therapy format: Individual psychotherapy Date: 03/20/2022      Start: 1:11p     Stop: 2:00p     Time Spent: 49 min Location: In-person   Session narrative (presenting needs, interim history, self-report of stressors and symptoms, applications of prior therapy, status changes, and interventions made in session) Driving to [place] tomorrow.  Strange not to feel strange about it.  Finding he does carry things better than he used to, not "have to" "get over" them.  Able to tolerate anxiety over time.  Affirmed and encouraged.  Concern for Magic cards and video games.  Figures to leave them here, best able, but really would rather not.  Won't really have room, and agreed they could become a major distraction.  Examined possibility of taking a small set (he has at least a trunk full at the house) but agreed better not to tempt himself right off the bat, and if he finds in a month that he can really use them and handle himself, he can come back and get them.    Is at peace with decision to leave the family household, trusts Tracy Robinson to be sensible enough and accommodated to the risk she takes with pregnancy and diabetes.  No hard terms with any family.  Maintaining control over vape and alcohol. Some amount of pot will remain.  Sleep and nutrition adequate.  Thinking ahead to the move, still has some uncertainty about help porting furniture upstairs but believes he has connections and is willing to hire if needed.    Needs to triage work to do for packing up.  Some risk of being overwhelmed, though he is scheduled to drive tomorrow.  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    Reviewed work done, therapy relationship, and most-hoped take-home messages, conveyed affirmations and willingness to connect with next therapist if desired.  Has resolved he will look up his former therapist from then.  While she had some ways which put him off a bit, believes he is capable of asking her better and guiding her in what he wants out of it on return.  Therapeutic modalities: Cognitive Behavioral Therapy, Solution-Oriented/Positive Psychology, Environmental manager, and Motivational Interviewing  Mental Status/Observations:  Appearance:   Casual     Behavior:  Appropriate  Motor:  Normal  Speech/Language:   Clear and Coherent  Affect:  Appropriate  Mood:  anxious  Thought process:  normal  Thought content:    worry  Sensory/Perceptual disturbances:    WNL  Orientation:  Fully oriented  Attention:  Good    Concentration:  Good  Memory:  WNL  Insight:    Good  Judgment:   Good  Impulse Control:  Good   Risk Assessment: Danger to Self: No Self-injurious Behavior: No Danger to Others: No Physical Aggression / Violence: No Duty to Warn: No Access to Firearms a concern: No  Assessment of progress:  progressing  Diagnosis:   ICD-10-CM   1. Generalized anxiety disorder  F41.1     2. Attention deficit hyperactivity disorder (ADHD), unspecified ADHD type  F90.9     3. Moderate episode of recurrent major depressive disorder (HCC)  F33.1     4. Polysubstance abuse (HCC) -- alcohol, marijuana, nicotine  F19.10    much reduced      Plan:  Substances Maintain vape abstinence, alcohol and pot abstain or moderate AA/NA available if need Continue to use delay and divert strategy to address urges to use. 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  Resolved end of September  widen search to nonacademic roles (go-go dance, office temp, etc.) Conversations with family as still needed for closure and blessing Watch for new relationship interest to be the next derailing distraction and manage Work preferably in dedicated, low-distraction environment Use known organization tactics and anti-resistance measures -- take it to an office, break down tasks, express aloud not wanting to, paradoxical "work"/write badly, "take it physical" (move while thinking), "take it social" (speak as to a friend, then write what's spoken).  Option write thank-you note from the future for things he will have done to enable it. Keep list of freeing things he can do for times of paralysis, irritability, or depression -- includes walking, singing, petting Stump, apply "the vegetable rule" (feel like a vegetable, get what vegetables need), cursing into a balloon until full, fantasize walking home from [place], say it aloud when he doesn't want to do something For general motivational and antianxiety coping --  "[name]" / felix felicis mindset to activate when feeling stymied/paralyzed trust he can/will "fall forward" whatever twists and turns still come today's reasonable sacrifices are gifts to somebody he loves (family member or tomorrow's self) daily application of "a little something" for living situation, dissertation, social connections, and play self-assure of "grandbuddy's" abiding love, may imagine her in touch regardless of actual spiritual beliefs Family obligation/stress  Self-affirm that [name] and mother both endorse going away and reclaiming his life/future.   When contending with loved ones' needs and dependencies, try to draw out listening and rational decision making rather than feel responsible to give, confront, or handle solutions for them.   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 Already scheduled visit in this office Visit date not found.  [name], PhD Tracy Czar, PhD LP Clinical Psychologist, [facility] [address] [place], [place] [address] [phone]

## 2022-03-28 DIAGNOSIS — F4323 Adjustment disorder with mixed anxiety and depressed mood: Secondary | ICD-10-CM | POA: Diagnosis not present

## 2022-04-01 DIAGNOSIS — F4323 Adjustment disorder with mixed anxiety and depressed mood: Secondary | ICD-10-CM | POA: Diagnosis not present

## 2022-04-02 ENCOUNTER — Ambulatory Visit: Payer: 59 | Admitting: Psychiatry

## 2022-04-03 DIAGNOSIS — F4323 Adjustment disorder with mixed anxiety and depressed mood: Secondary | ICD-10-CM | POA: Diagnosis not present

## 2022-04-08 ENCOUNTER — Ambulatory Visit: Payer: 59 | Admitting: Psychiatry

## 2022-04-08 DIAGNOSIS — F4323 Adjustment disorder with mixed anxiety and depressed mood: Secondary | ICD-10-CM | POA: Diagnosis not present

## 2022-04-10 DIAGNOSIS — F4323 Adjustment disorder with mixed anxiety and depressed mood: Secondary | ICD-10-CM | POA: Diagnosis not present

## 2022-04-15 DIAGNOSIS — F4323 Adjustment disorder with mixed anxiety and depressed mood: Secondary | ICD-10-CM | POA: Diagnosis not present

## 2022-04-17 DIAGNOSIS — F4323 Adjustment disorder with mixed anxiety and depressed mood: Secondary | ICD-10-CM | POA: Diagnosis not present

## 2022-04-18 DIAGNOSIS — F4323 Adjustment disorder with mixed anxiety and depressed mood: Secondary | ICD-10-CM | POA: Diagnosis not present

## 2022-04-18 DIAGNOSIS — F331 Major depressive disorder, recurrent, moderate: Secondary | ICD-10-CM | POA: Diagnosis not present

## 2022-04-18 DIAGNOSIS — F902 Attention-deficit hyperactivity disorder, combined type: Secondary | ICD-10-CM | POA: Diagnosis not present

## 2022-04-18 DIAGNOSIS — F411 Generalized anxiety disorder: Secondary | ICD-10-CM | POA: Diagnosis not present

## 2022-04-22 DIAGNOSIS — F4323 Adjustment disorder with mixed anxiety and depressed mood: Secondary | ICD-10-CM | POA: Diagnosis not present

## 2022-04-24 DIAGNOSIS — F4323 Adjustment disorder with mixed anxiety and depressed mood: Secondary | ICD-10-CM | POA: Diagnosis not present

## 2022-04-29 DIAGNOSIS — F4323 Adjustment disorder with mixed anxiety and depressed mood: Secondary | ICD-10-CM | POA: Diagnosis not present

## 2022-04-30 DIAGNOSIS — F4323 Adjustment disorder with mixed anxiety and depressed mood: Secondary | ICD-10-CM | POA: Diagnosis not present

## 2022-05-02 DIAGNOSIS — F331 Major depressive disorder, recurrent, moderate: Secondary | ICD-10-CM | POA: Diagnosis not present

## 2022-05-02 DIAGNOSIS — F902 Attention-deficit hyperactivity disorder, combined type: Secondary | ICD-10-CM | POA: Diagnosis not present

## 2022-05-02 DIAGNOSIS — F411 Generalized anxiety disorder: Secondary | ICD-10-CM | POA: Diagnosis not present

## 2022-05-06 DIAGNOSIS — F4323 Adjustment disorder with mixed anxiety and depressed mood: Secondary | ICD-10-CM | POA: Diagnosis not present

## 2022-05-08 DIAGNOSIS — F4323 Adjustment disorder with mixed anxiety and depressed mood: Secondary | ICD-10-CM | POA: Diagnosis not present

## 2022-05-09 DIAGNOSIS — F4323 Adjustment disorder with mixed anxiety and depressed mood: Secondary | ICD-10-CM | POA: Diagnosis not present

## 2022-05-13 DIAGNOSIS — F4323 Adjustment disorder with mixed anxiety and depressed mood: Secondary | ICD-10-CM | POA: Diagnosis not present

## 2022-05-15 DIAGNOSIS — F4323 Adjustment disorder with mixed anxiety and depressed mood: Secondary | ICD-10-CM | POA: Diagnosis not present

## 2022-05-22 DIAGNOSIS — F411 Generalized anxiety disorder: Secondary | ICD-10-CM | POA: Diagnosis not present

## 2022-05-22 DIAGNOSIS — F331 Major depressive disorder, recurrent, moderate: Secondary | ICD-10-CM | POA: Diagnosis not present

## 2022-05-22 DIAGNOSIS — F902 Attention-deficit hyperactivity disorder, combined type: Secondary | ICD-10-CM | POA: Diagnosis not present

## 2022-05-27 DIAGNOSIS — F4323 Adjustment disorder with mixed anxiety and depressed mood: Secondary | ICD-10-CM | POA: Diagnosis not present

## 2022-05-29 DIAGNOSIS — F4323 Adjustment disorder with mixed anxiety and depressed mood: Secondary | ICD-10-CM | POA: Diagnosis not present

## 2022-05-30 DIAGNOSIS — T7840XA Allergy, unspecified, initial encounter: Secondary | ICD-10-CM | POA: Diagnosis not present

## 2022-05-30 DIAGNOSIS — Z1331 Encounter for screening for depression: Secondary | ICD-10-CM | POA: Diagnosis not present

## 2022-05-30 DIAGNOSIS — Z0000 Encounter for general adult medical examination without abnormal findings: Secondary | ICD-10-CM | POA: Diagnosis not present

## 2022-06-03 DIAGNOSIS — F4323 Adjustment disorder with mixed anxiety and depressed mood: Secondary | ICD-10-CM | POA: Diagnosis not present

## 2022-06-10 DIAGNOSIS — F4323 Adjustment disorder with mixed anxiety and depressed mood: Secondary | ICD-10-CM | POA: Diagnosis not present

## 2022-06-17 DIAGNOSIS — F4323 Adjustment disorder with mixed anxiety and depressed mood: Secondary | ICD-10-CM | POA: Diagnosis not present

## 2022-06-19 DIAGNOSIS — F4323 Adjustment disorder with mixed anxiety and depressed mood: Secondary | ICD-10-CM | POA: Diagnosis not present

## 2022-06-21 DIAGNOSIS — Z419 Encounter for procedure for purposes other than remedying health state, unspecified: Secondary | ICD-10-CM | POA: Diagnosis not present

## 2022-06-22 DIAGNOSIS — F4323 Adjustment disorder with mixed anxiety and depressed mood: Secondary | ICD-10-CM | POA: Diagnosis not present

## 2022-06-24 DIAGNOSIS — F4323 Adjustment disorder with mixed anxiety and depressed mood: Secondary | ICD-10-CM | POA: Diagnosis not present

## 2022-06-26 DIAGNOSIS — F331 Major depressive disorder, recurrent, moderate: Secondary | ICD-10-CM | POA: Diagnosis not present

## 2022-06-26 DIAGNOSIS — F4323 Adjustment disorder with mixed anxiety and depressed mood: Secondary | ICD-10-CM | POA: Diagnosis not present

## 2022-06-26 DIAGNOSIS — F411 Generalized anxiety disorder: Secondary | ICD-10-CM | POA: Diagnosis not present

## 2022-07-01 DIAGNOSIS — F4323 Adjustment disorder with mixed anxiety and depressed mood: Secondary | ICD-10-CM | POA: Diagnosis not present

## 2022-07-03 DIAGNOSIS — F4323 Adjustment disorder with mixed anxiety and depressed mood: Secondary | ICD-10-CM | POA: Diagnosis not present

## 2022-07-08 DIAGNOSIS — F4323 Adjustment disorder with mixed anxiety and depressed mood: Secondary | ICD-10-CM | POA: Diagnosis not present

## 2022-07-10 DIAGNOSIS — F4323 Adjustment disorder with mixed anxiety and depressed mood: Secondary | ICD-10-CM | POA: Diagnosis not present

## 2022-07-22 DIAGNOSIS — Z419 Encounter for procedure for purposes other than remedying health state, unspecified: Secondary | ICD-10-CM | POA: Diagnosis not present

## 2022-07-24 DIAGNOSIS — F331 Major depressive disorder, recurrent, moderate: Secondary | ICD-10-CM | POA: Diagnosis not present

## 2022-07-24 DIAGNOSIS — F411 Generalized anxiety disorder: Secondary | ICD-10-CM | POA: Diagnosis not present

## 2022-07-24 DIAGNOSIS — F4323 Adjustment disorder with mixed anxiety and depressed mood: Secondary | ICD-10-CM | POA: Diagnosis not present

## 2022-07-29 DIAGNOSIS — F4323 Adjustment disorder with mixed anxiety and depressed mood: Secondary | ICD-10-CM | POA: Diagnosis not present

## 2022-07-31 DIAGNOSIS — F4323 Adjustment disorder with mixed anxiety and depressed mood: Secondary | ICD-10-CM | POA: Diagnosis not present

## 2022-08-07 DIAGNOSIS — F4323 Adjustment disorder with mixed anxiety and depressed mood: Secondary | ICD-10-CM | POA: Diagnosis not present

## 2022-08-12 DIAGNOSIS — F4323 Adjustment disorder with mixed anxiety and depressed mood: Secondary | ICD-10-CM | POA: Diagnosis not present

## 2022-08-14 DIAGNOSIS — F4323 Adjustment disorder with mixed anxiety and depressed mood: Secondary | ICD-10-CM | POA: Diagnosis not present

## 2022-08-19 DIAGNOSIS — F4323 Adjustment disorder with mixed anxiety and depressed mood: Secondary | ICD-10-CM | POA: Diagnosis not present

## 2022-08-21 DIAGNOSIS — F4323 Adjustment disorder with mixed anxiety and depressed mood: Secondary | ICD-10-CM | POA: Diagnosis not present

## 2022-08-22 DIAGNOSIS — Z419 Encounter for procedure for purposes other than remedying health state, unspecified: Secondary | ICD-10-CM | POA: Diagnosis not present

## 2022-08-26 ENCOUNTER — Telehealth: Payer: Self-pay

## 2022-08-26 DIAGNOSIS — F4323 Adjustment disorder with mixed anxiety and depressed mood: Secondary | ICD-10-CM | POA: Diagnosis not present

## 2022-08-26 NOTE — Telephone Encounter (Signed)
Mychart msg sent. AS, CMA 

## 2022-08-28 DIAGNOSIS — F4323 Adjustment disorder with mixed anxiety and depressed mood: Secondary | ICD-10-CM | POA: Diagnosis not present

## 2022-09-02 DIAGNOSIS — F4323 Adjustment disorder with mixed anxiety and depressed mood: Secondary | ICD-10-CM | POA: Diagnosis not present

## 2022-09-06 DIAGNOSIS — Z20822 Contact with and (suspected) exposure to covid-19: Secondary | ICD-10-CM | POA: Diagnosis not present

## 2022-09-06 DIAGNOSIS — B9789 Other viral agents as the cause of diseases classified elsewhere: Secondary | ICD-10-CM | POA: Diagnosis not present

## 2022-09-06 DIAGNOSIS — F4323 Adjustment disorder with mixed anxiety and depressed mood: Secondary | ICD-10-CM | POA: Diagnosis not present

## 2022-09-06 DIAGNOSIS — J019 Acute sinusitis, unspecified: Secondary | ICD-10-CM | POA: Diagnosis not present

## 2022-09-09 DIAGNOSIS — F4323 Adjustment disorder with mixed anxiety and depressed mood: Secondary | ICD-10-CM | POA: Diagnosis not present

## 2022-09-11 DIAGNOSIS — F411 Generalized anxiety disorder: Secondary | ICD-10-CM | POA: Diagnosis not present

## 2022-09-11 DIAGNOSIS — F331 Major depressive disorder, recurrent, moderate: Secondary | ICD-10-CM | POA: Diagnosis not present

## 2022-09-16 DIAGNOSIS — F4323 Adjustment disorder with mixed anxiety and depressed mood: Secondary | ICD-10-CM | POA: Diagnosis not present

## 2022-09-20 DIAGNOSIS — Z419 Encounter for procedure for purposes other than remedying health state, unspecified: Secondary | ICD-10-CM | POA: Diagnosis not present

## 2022-09-21 DIAGNOSIS — F4323 Adjustment disorder with mixed anxiety and depressed mood: Secondary | ICD-10-CM | POA: Diagnosis not present

## 2022-09-23 DIAGNOSIS — F4323 Adjustment disorder with mixed anxiety and depressed mood: Secondary | ICD-10-CM | POA: Diagnosis not present

## 2022-09-25 DIAGNOSIS — F4323 Adjustment disorder with mixed anxiety and depressed mood: Secondary | ICD-10-CM | POA: Diagnosis not present

## 2022-09-30 DIAGNOSIS — F4323 Adjustment disorder with mixed anxiety and depressed mood: Secondary | ICD-10-CM | POA: Diagnosis not present

## 2022-10-04 DIAGNOSIS — F4323 Adjustment disorder with mixed anxiety and depressed mood: Secondary | ICD-10-CM | POA: Diagnosis not present

## 2022-10-07 DIAGNOSIS — F4323 Adjustment disorder with mixed anxiety and depressed mood: Secondary | ICD-10-CM | POA: Diagnosis not present

## 2022-10-09 DIAGNOSIS — F4323 Adjustment disorder with mixed anxiety and depressed mood: Secondary | ICD-10-CM | POA: Diagnosis not present

## 2022-10-10 DIAGNOSIS — F331 Major depressive disorder, recurrent, moderate: Secondary | ICD-10-CM | POA: Diagnosis not present

## 2022-10-10 DIAGNOSIS — F411 Generalized anxiety disorder: Secondary | ICD-10-CM | POA: Diagnosis not present

## 2022-10-14 DIAGNOSIS — F4323 Adjustment disorder with mixed anxiety and depressed mood: Secondary | ICD-10-CM | POA: Diagnosis not present

## 2022-10-21 DIAGNOSIS — Z419 Encounter for procedure for purposes other than remedying health state, unspecified: Secondary | ICD-10-CM | POA: Diagnosis not present

## 2022-10-21 DIAGNOSIS — F4323 Adjustment disorder with mixed anxiety and depressed mood: Secondary | ICD-10-CM | POA: Diagnosis not present

## 2022-10-28 DIAGNOSIS — F4323 Adjustment disorder with mixed anxiety and depressed mood: Secondary | ICD-10-CM | POA: Diagnosis not present

## 2022-11-04 ENCOUNTER — Encounter: Payer: Self-pay | Admitting: *Deleted

## 2022-11-04 DIAGNOSIS — F4323 Adjustment disorder with mixed anxiety and depressed mood: Secondary | ICD-10-CM | POA: Diagnosis not present

## 2022-11-07 DIAGNOSIS — F331 Major depressive disorder, recurrent, moderate: Secondary | ICD-10-CM | POA: Diagnosis not present

## 2022-11-07 DIAGNOSIS — F411 Generalized anxiety disorder: Secondary | ICD-10-CM | POA: Diagnosis not present

## 2022-11-11 DIAGNOSIS — F4323 Adjustment disorder with mixed anxiety and depressed mood: Secondary | ICD-10-CM | POA: Diagnosis not present

## 2022-11-18 DIAGNOSIS — F4323 Adjustment disorder with mixed anxiety and depressed mood: Secondary | ICD-10-CM | POA: Diagnosis not present

## 2022-11-20 DIAGNOSIS — Z419 Encounter for procedure for purposes other than remedying health state, unspecified: Secondary | ICD-10-CM | POA: Diagnosis not present

## 2022-12-21 DIAGNOSIS — Z419 Encounter for procedure for purposes other than remedying health state, unspecified: Secondary | ICD-10-CM | POA: Diagnosis not present

## 2023-01-20 DIAGNOSIS — Z419 Encounter for procedure for purposes other than remedying health state, unspecified: Secondary | ICD-10-CM | POA: Diagnosis not present

## 2023-02-20 DIAGNOSIS — Z419 Encounter for procedure for purposes other than remedying health state, unspecified: Secondary | ICD-10-CM | POA: Diagnosis not present

## 2023-03-18 DIAGNOSIS — Z743 Need for continuous supervision: Secondary | ICD-10-CM | POA: Diagnosis not present

## 2023-03-18 DIAGNOSIS — R52 Pain, unspecified: Secondary | ICD-10-CM | POA: Diagnosis not present

## 2023-03-23 DIAGNOSIS — Z419 Encounter for procedure for purposes other than remedying health state, unspecified: Secondary | ICD-10-CM | POA: Diagnosis not present

## 2023-04-22 DIAGNOSIS — Z419 Encounter for procedure for purposes other than remedying health state, unspecified: Secondary | ICD-10-CM | POA: Diagnosis not present

## 2023-04-28 ENCOUNTER — Ambulatory Visit (INDEPENDENT_AMBULATORY_CARE_PROVIDER_SITE_OTHER): Payer: BC Managed Care – PPO | Admitting: Licensed Clinical Social Worker

## 2023-04-28 DIAGNOSIS — F900 Attention-deficit hyperactivity disorder, predominantly inattentive type: Secondary | ICD-10-CM | POA: Diagnosis not present

## 2023-04-28 DIAGNOSIS — F411 Generalized anxiety disorder: Secondary | ICD-10-CM

## 2023-04-28 DIAGNOSIS — F331 Major depressive disorder, recurrent, moderate: Secondary | ICD-10-CM
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
## 2023-04-29 ENCOUNTER — Telehealth (HOSPITAL_COMMUNITY): Payer: Self-pay | Admitting: *Deleted

## 2023-04-29 ENCOUNTER — Ambulatory Visit (HOSPITAL_COMMUNITY): Payer: BC Managed Care – PPO | Admitting: Physician Assistant

## 2023-04-29 DIAGNOSIS — F909 Attention-deficit hyperactivity disorder, unspecified type: Secondary | ICD-10-CM | POA: Diagnosis not present

## 2023-04-29 DIAGNOSIS — F331 Major depressive disorder, recurrent, moderate: Secondary | ICD-10-CM | POA: Diagnosis not present

## 2023-04-29 DIAGNOSIS — F411 Generalized anxiety disorder: Secondary | ICD-10-CM | POA: Diagnosis not present

## 2023-04-29 MED ORDER — AMPHETAMINE-DEXTROAMPHET ER 30 MG PO CP24: 30.0000 mg | ORAL_CAPSULE | Freq: Every day | ORAL | 0 refills | Status: DC

## 2023-04-29 MED ORDER — LAMOTRIGINE 25 MG PO TABS: 25.0000 mg | ORAL_TABLET | Freq: Every day | ORAL | 1 refills | Status: DC

## 2023-04-29 MED ORDER — BUSPIRONE HCL 10 MG PO TABS: 10.0000 mg | ORAL_TABLET | Freq: Two times a day (BID) | ORAL | 1 refills | Status: DC
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
## 2023-04-29 NOTE — Telephone Encounter (Signed)
Patient called and states that his Adderall XR is on backorder at his pharmacy and not sure when it will be available. Asking if he can get a one time order for the "regular"  Adderall since his pharmacy has it in stock.

## 2023-04-29 NOTE — Telephone Encounter (Signed)
Patient called back and found Adderall XR at [facility] on [address]. Asking that it be sent there for him to pick up.

## 2023-05-02 ENCOUNTER — Encounter (HOSPITAL_COMMUNITY): Payer: Self-pay | Admitting: Physician Assistant

## 2023-05-05 NOTE — Telephone Encounter (Signed)
Message acknowledged and reviewed.

## 2023-05-05 NOTE — Telephone Encounter (Signed)
Message acknowledged and reviewed.

## 2023-05-10 ENCOUNTER — Other Ambulatory Visit (HOSPITAL_COMMUNITY): Payer: Self-pay | Admitting: Physician Assistant

## 2023-05-10 DIAGNOSIS — F331 Major depressive disorder, recurrent, moderate: Secondary | ICD-10-CM

## 2023-05-10 DIAGNOSIS — F411 Generalized anxiety disorder: Secondary | ICD-10-CM

## 2023-05-13 ENCOUNTER — Telehealth (HOSPITAL_COMMUNITY): Payer: Self-pay | Admitting: *Deleted

## 2023-05-13 ENCOUNTER — Other Ambulatory Visit (HOSPITAL_COMMUNITY): Payer: Self-pay | Admitting: Psychiatry

## 2023-05-13 ENCOUNTER — Other Ambulatory Visit (HOSPITAL_COMMUNITY): Payer: Self-pay | Admitting: Physician Assistant

## 2023-05-13 DIAGNOSIS — F331 Major depressive disorder, recurrent, moderate: Secondary | ICD-10-CM

## 2023-05-13 DIAGNOSIS — F411 Generalized anxiety disorder: Secondary | ICD-10-CM

## 2023-05-13 MED ORDER — SERTRALINE HCL 100 MG PO TABS: 200.0000 mg | ORAL_TABLET | Freq: Every day | ORAL | 0 refills | Status: DC

## 2023-05-13 NOTE — Telephone Encounter (Signed)
Patient called for refill of Zoloft. Chart reviewed and medication not filled since 2023. Has a new patient visit on 10/31. Last saw provider on 04/29/23. States  [place] had [place] of [place] and came back. Was seen here as a walk in but did not need a refill at that time. Now has been 5 days without Zoloft and asking for a bridge to help him get to his next appointment on 10/31.

## 2023-05-13 NOTE — Progress Notes (Signed)
Provider was contacted by Cherylann Ratel, RN regarding patient's medication refill.  Patient's medication to be e-prescribed to pharmacy of choice.

## 2023-05-16 NOTE — Telephone Encounter (Signed)
Message acknowledged and reviewed.

## 2023-05-22 ENCOUNTER — Ambulatory Visit (INDEPENDENT_AMBULATORY_CARE_PROVIDER_SITE_OTHER): Payer: BC Managed Care – PPO | Admitting: Student

## 2023-05-22 VITALS — BP 127/95 | HR 79 | Ht 68.0 in | Wt 181.8 lb

## 2023-05-22 DIAGNOSIS — F411 Generalized anxiety disorder: Secondary | ICD-10-CM | POA: Diagnosis not present

## 2023-05-22 DIAGNOSIS — F331 Major depressive disorder, recurrent, moderate: Secondary | ICD-10-CM | POA: Diagnosis not present

## 2023-05-22 DIAGNOSIS — F1910 Other psychoactive substance abuse, uncomplicated: Secondary | ICD-10-CM | POA: Diagnosis not present

## 2023-05-22 DIAGNOSIS — R29818 Other symptoms and signs involving the nervous system: Secondary | ICD-10-CM

## 2023-05-22 DIAGNOSIS — F909 Attention-deficit hyperactivity disorder, unspecified type: Secondary | ICD-10-CM | POA: Diagnosis not present

## 2023-05-22 DIAGNOSIS — Z5181 Encounter for therapeutic drug level monitoring: Secondary | ICD-10-CM

## 2023-05-22 DIAGNOSIS — R5383 Other fatigue: Secondary | ICD-10-CM

## 2023-05-22 MED ORDER — VILOXAZINE HCL ER 200 MG PO CP24
200.0000 mg | ORAL_CAPSULE | Freq: Every day | ORAL | 1 refills | Status: AC
Start: 2023-05-22 — End: 2023-07-21
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
## 2023-05-23 ENCOUNTER — Ambulatory Visit (HOSPITAL_COMMUNITY): Payer: BC Managed Care – PPO | Admitting: Student

## 2023-05-23 DIAGNOSIS — Z419 Encounter for procedure for purposes other than remedying health state, unspecified: Secondary | ICD-10-CM | POA: Diagnosis not present

## 2023-05-26 ENCOUNTER — Other Ambulatory Visit (HOSPITAL_COMMUNITY): Payer: Self-pay | Admitting: Psychiatry

## 2023-05-26 ENCOUNTER — Encounter (HOSPITAL_COMMUNITY): Payer: Self-pay

## 2023-05-26 ENCOUNTER — Other Ambulatory Visit (HOSPITAL_COMMUNITY): Payer: BC Managed Care – PPO

## 2023-05-26 ENCOUNTER — Telehealth (HOSPITAL_COMMUNITY): Payer: Self-pay | Admitting: Student

## 2023-05-26 ENCOUNTER — Other Ambulatory Visit (HOSPITAL_COMMUNITY): Payer: Self-pay | Admitting: Student

## 2023-05-26 DIAGNOSIS — R5383 Other fatigue: Secondary | ICD-10-CM | POA: Diagnosis not present

## 2023-05-26 DIAGNOSIS — Z5181 Encounter for therapeutic drug level monitoring: Secondary | ICD-10-CM | POA: Diagnosis not present

## 2023-05-26 DIAGNOSIS — R29818 Other symptoms and signs involving the nervous system: Secondary | ICD-10-CM

## 2023-05-26 NOTE — Telephone Encounter (Signed)
Sending in prior authorization today.

## 2023-05-27 ENCOUNTER — Telehealth (HOSPITAL_COMMUNITY): Payer: Self-pay | Admitting: *Deleted

## 2023-05-27 NOTE — Telephone Encounter (Signed)
Was that the response on the prior authorization submitted?

## 2023-05-27 NOTE — Telephone Encounter (Signed)
Ok, thank you. I'll touch base with the patient.

## 2023-05-27 NOTE — Telephone Encounter (Signed)
PER ALI IT WAS A AUTOMATED VM LEFT

## 2023-05-27 NOTE — Telephone Encounter (Signed)
DR COSBY MEDICATION DENIED BY INSURANCE  viloxazine ER (QELBREE) 200 MG 24 hr capsule   Take 1 capsule (200 mg total) by mouth daily

## 2023-05-28 ENCOUNTER — Encounter (HOSPITAL_COMMUNITY): Payer: Self-pay | Admitting: Student

## 2023-05-28 DIAGNOSIS — R5383 Other fatigue: Secondary | ICD-10-CM | POA: Insufficient documentation

## 2023-05-28 DIAGNOSIS — R29818 Other symptoms and signs involving the nervous system: Secondary | ICD-10-CM | POA: Insufficient documentation

## 2023-05-28 LAB — COMPREHENSIVE METABOLIC PANEL
ALT: 40 [IU]/L (ref 0–44)
AST: 33 [IU]/L (ref 0–40)
Albumin: 5 g/dL (ref 4.1–5.1)
Alkaline Phosphatase: 62 [IU]/L (ref 44–121)
BUN/Creatinine Ratio: 19 (ref 9–20)
BUN: 19 mg/dL (ref 6–20)
Bilirubin Total: 0.3 mg/dL (ref 0.0–1.2)
CO2: 26 mmol/L (ref 20–29)
Calcium: 10.4 mg/dL — ABNORMAL HIGH (ref 8.7–10.2)
Chloride: 99 mmol/L (ref 96–106)
Creatinine, Ser: 1 mg/dL (ref 0.76–1.27)
Globulin, Total: 2.3 g/dL (ref 1.5–4.5)
Glucose: 86 mg/dL (ref 70–99)
Potassium: 4.7 mmol/L (ref 3.5–5.2)
Sodium: 141 mmol/L (ref 134–144)
Total Protein: 7.3 g/dL (ref 6.0–8.5)
eGFR: 102 mL/min/{1.73_m2} (ref >59)

## 2023-05-28 LAB — CBC WITH DIFFERENTIAL/PLATELET
Basophils Absolute: 0.1 10*3/uL (ref 0.0–0.2)
Basos: 1 %
EOS (ABSOLUTE): 0.2 10*3/uL (ref 0.0–0.4)
Eos: 3 %
Hematocrit: 49.1 % (ref 37.5–51.0)
Hemoglobin: 15.8 g/dL (ref 13.0–17.7)
Immature Grans (Abs): 0.1 10*3/uL (ref 0.0–0.1)
Immature Granulocytes: 1 %
Lymphocytes Absolute: 2.1 10*3/uL (ref 0.7–3.1)
Lymphs: 33 %
MCH: 29.9 pg (ref 26.6–33.0)
MCHC: 32.2 g/dL (ref 31.5–35.7)
MCV: 93 fL (ref 79–97)
Monocytes Absolute: 0.5 10*3/uL (ref 0.1–0.9)
Monocytes: 8 %
Neutrophils Absolute: 3.4 10*3/uL (ref 1.4–7.0)
Neutrophils: 54 %
Platelets: 272 10*3/uL (ref 150–450)
RBC: 5.29 x10E6/uL (ref 4.14–5.80)
RDW: 12.6 % (ref 11.6–15.4)
WBC: 6.3 10*3/uL (ref 3.4–10.8)

## 2023-05-28 LAB — DRUG SCREEN, URINE
Amphetamines, Urine: NEGATIVE ng/mL
Barbiturate screen, urine: NEGATIVE ng/mL
Benzodiazepine Quant, Ur: NEGATIVE ng/mL
Cannabinoid Quant, Ur: NEGATIVE ng/mL
Cocaine (Metab.): NEGATIVE ng/mL
Opiate Quant, Ur: NEGATIVE ng/mL
PCP Quant, Ur: NEGATIVE ng/mL

## 2023-05-28 LAB — LIPID PANEL
Chol/HDL Ratio: 5.1 ratio — ABNORMAL HIGH (ref 0.0–5.0)
Cholesterol, Total: 254 mg/dL — ABNORMAL HIGH (ref 100–199)
HDL: 50 mg/dL (ref >39)
LDL Chol Calc (NIH): 157 mg/dL — ABNORMAL HIGH (ref 0–99)
Triglycerides: 255 mg/dL — ABNORMAL HIGH (ref 0–149)
VLDL Cholesterol Cal: 47 mg/dL — ABNORMAL HIGH (ref 5–40)

## 2023-05-28 LAB — HEMOGLOBIN A1C
Est. average glucose Bld gHb Est-mCnc: 100 mg/dL
Hgb A1c MFr Bld: 5.1 % (ref 4.8–5.6)

## 2023-05-28 LAB — TSH: TSH: 1.73 u[IU]/mL (ref 0.450–4.500)

## 2023-06-02 ENCOUNTER — Ambulatory Visit (HOSPITAL_COMMUNITY): Payer: BC Managed Care – PPO | Admitting: Licensed Clinical Social Worker

## 2023-06-02 DIAGNOSIS — F411 Generalized anxiety disorder: Secondary | ICD-10-CM

## 2023-06-02 DIAGNOSIS — F331 Major depressive disorder, recurrent, moderate: Secondary | ICD-10-CM

## 2023-06-02 DIAGNOSIS — F900 Attention-deficit hyperactivity disorder, predominantly inattentive type: Secondary | ICD-10-CM

## 2023-06-02 NOTE — Progress Notes (Signed)
   THERAPIST PROGRESS NOTE  Virtual Visit via Video Note  I connected with [name] on 06/02/23 at  3:00 PM EST by a video enabled telemedicine application and verified that I am speaking with the correct person using two identifiers.  Location: Patient: [facility]  Provider: [facility]    I discussed the limitations of evaluation and management by telemedicine and the availability of in person appointments. The patient expressed understanding and agreed to proceed.    I discussed the assessment and treatment plan with the patient. The patient was provided an opportunity to ask questions and all were answered. The patient agreed with the plan and demonstrated an understanding of the instructions.   The patient was advised to call back or seek an in-person evaluation if the symptoms worsen or if the condition fails to improve as anticipated.  I provided 45 minutes of non-face-to-face time during this encounter.   Weber Cooks, LCSW   Participation Level: Active  Behavioral Response: CasualAlertAnxious and Depressed  Type of Therapy: Individual Therapy  Treatment Goals addressed:   ProgressTowards Goals: Progressing  Interventions: CBT and Supportive  Suicidal/Homicidal: Nowithout intent/plan  Therapist Response:    Verdell was alert and oriented x 5.  He was pleasant, cooperative, maintained good eye contact.  He engaged well in therapy session was dressed casually.  He presented today with anxious and depressed mood\affect.  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  Patient reports since his anxiety and depression have increased he has had a lack of motivation.  He reports that things generally came easy to him.  Patient reports that his procrastination for his dissertation due to his mental health symptoms has now put him in a time crunch.  Other stressors for patient is poor sleep.  He reports that due to his  poor sleep he is feeling more fatigue in the mornings.  Intervention/plan: LCSW provided patient worksheets for coping skills for anxiety, sleep hygiene, growth mindset, goal breakdown, and gratitude exercises.  LCSW then emailed all these worksheets to patient to review.  LCSW educated patient on partiallizing goals to make them more manageable and decrease depression and anxiety towards them.  LCSW educated patient on how organization such as utilizing a planner can help decrease depression and anxiety.  LCSW used supportive therapy for praise and encouragement.  LCSW used cognitive behavioral therapy for reframing.    Plan: Return again in 3 weeks.  Diagnosis: No diagnosis found.  Collaboration of Care: Other none today   Patient/Guardian was advised Release of Information must be obtained prior to any record release in order to collaborate their care with an outside provider. Patient/Guardian was advised if they have not already done so to contact the registration department to sign all necessary forms in order for [place] to release information regarding their care.   Consent: Patient/Guardian gives verbal consent for treatment and assignment of benefits for services provided during this visit. Patient/Guardian expressed understanding and agreed to proceed.   Weber Cooks, LCSW 06/02/2023

## 2023-06-22 DIAGNOSIS — Z419 Encounter for procedure for purposes other than remedying health state, unspecified: Secondary | ICD-10-CM | POA: Diagnosis not present

## 2023-06-23 ENCOUNTER — Ambulatory Visit (HOSPITAL_COMMUNITY): Payer: BC Managed Care – PPO | Admitting: Licensed Clinical Social Worker

## 2023-06-23 ENCOUNTER — Encounter (HOSPITAL_COMMUNITY): Payer: Self-pay

## 2023-06-27 ENCOUNTER — Ambulatory Visit (INDEPENDENT_AMBULATORY_CARE_PROVIDER_SITE_OTHER): Payer: BC Managed Care – PPO | Admitting: Student

## 2023-06-27 DIAGNOSIS — F1910 Other psychoactive substance abuse, uncomplicated: Secondary | ICD-10-CM

## 2023-06-27 DIAGNOSIS — F909 Attention-deficit hyperactivity disorder, unspecified type: Secondary | ICD-10-CM | POA: Diagnosis not present

## 2023-06-27 DIAGNOSIS — F411 Generalized anxiety disorder: Secondary | ICD-10-CM | POA: Diagnosis not present

## 2023-06-27 DIAGNOSIS — F331 Major depressive disorder, recurrent, moderate: Secondary | ICD-10-CM

## 2023-06-27 DIAGNOSIS — R29818 Other symptoms and signs involving the nervous system: Secondary | ICD-10-CM | POA: Diagnosis not present
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
## 2023-07-03 ENCOUNTER — Encounter (HOSPITAL_COMMUNITY): Payer: Self-pay | Admitting: Addiction Medicine

## 2023-07-03 ENCOUNTER — Telehealth (HOSPITAL_COMMUNITY): Payer: Self-pay | Admitting: Student

## 2023-07-03 DIAGNOSIS — F411 Generalized anxiety disorder: Secondary | ICD-10-CM

## 2023-07-03 DIAGNOSIS — F331 Major depressive disorder, recurrent, moderate: Secondary | ICD-10-CM

## 2023-07-03 MED ORDER — BUSPIRONE HCL 10 MG PO TABS: 10.0000 mg | ORAL_TABLET | Freq: Two times a day (BID) | ORAL | 1 refills | Status: DC

## 2023-07-03 MED ORDER — LAMOTRIGINE 25 MG PO TABS: 25.0000 mg | ORAL_TABLET | Freq: Every day | ORAL | 1 refills | Status: DC

## 2023-07-03 MED ORDER — AMPHETAMINE-DEXTROAMPHET ER 30 MG PO CP24: 30.0000 mg | ORAL_CAPSULE | Freq: Every day | ORAL | 0 refills | Status: DC

## 2023-07-03 MED ORDER — NALTREXONE HCL 50 MG PO TABS: 50.0000 mg | ORAL_TABLET | Freq: Every day | ORAL | 0 refills | Status: DC

## 2023-07-24 ENCOUNTER — Ambulatory Visit (INDEPENDENT_AMBULATORY_CARE_PROVIDER_SITE_OTHER): Payer: BC Managed Care – PPO | Admitting: Student

## 2023-07-24 ENCOUNTER — Ambulatory Visit (HOSPITAL_COMMUNITY): Payer: BC Managed Care – PPO | Admitting: Licensed Clinical Social Worker

## 2023-07-24 VITALS — BP 141/83 | HR 90 | Ht 68.0 in | Wt 183.8 lb

## 2023-07-24 DIAGNOSIS — F411 Generalized anxiety disorder: Secondary | ICD-10-CM

## 2023-07-24 DIAGNOSIS — F331 Major depressive disorder, recurrent, moderate: Secondary | ICD-10-CM

## 2023-07-24 DIAGNOSIS — F1021 Alcohol dependence, in remission: Secondary | ICD-10-CM | POA: Insufficient documentation

## 2023-07-24 DIAGNOSIS — F909 Attention-deficit hyperactivity disorder, unspecified type: Secondary | ICD-10-CM

## 2023-07-24 HISTORY — DX: Alcohol dependence, in remission: F10.21

## 2023-07-24 MED ORDER — AMPHETAMINE-DEXTROAMPHET ER 30 MG PO CP24: 30.0000 mg | ORAL_CAPSULE | Freq: Every day | ORAL | 0 refills | Status: DC

## 2023-07-24 MED ORDER — SERTRALINE HCL 100 MG PO TABS: 200.0000 mg | ORAL_TABLET | Freq: Every day | ORAL | 0 refills | Status: DC

## 2023-07-24 MED ORDER — NALTREXONE HCL 50 MG PO TABS
50.0000 mg | ORAL_TABLET | Freq: Every day | ORAL | 0 refills | Status: AC
Start: 2023-07-24 — End: 2023-08-23
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
## 2023-08-03 DIAGNOSIS — Z419 Encounter for procedure for purposes other than remedying health state, unspecified: Secondary | ICD-10-CM | POA: Diagnosis not present

## 2023-08-08 ENCOUNTER — Ambulatory Visit (INDEPENDENT_AMBULATORY_CARE_PROVIDER_SITE_OTHER): Payer: BC Managed Care – PPO | Admitting: Licensed Clinical Social Worker

## 2023-08-08 DIAGNOSIS — F902 Attention-deficit hyperactivity disorder, combined type: Secondary | ICD-10-CM | POA: Diagnosis not present

## 2023-08-08 DIAGNOSIS — F331 Major depressive disorder, recurrent, moderate: Secondary | ICD-10-CM | POA: Diagnosis not present

## 2023-08-08 DIAGNOSIS — F411 Generalized anxiety disorder: Secondary | ICD-10-CM | POA: Diagnosis not present

## 2023-08-08 NOTE — Progress Notes (Signed)
THERAPIST PROGRESS NOTE  Virtual Visit via Video Note  I connected with [name] on 08/08/23 at 11:00 AM EST by a video enabled telemedicine application and verified that I am speaking with the correct person using two identifiers.  Location: Patient: [facility]  Provider: Providers Home    I discussed the limitations of evaluation and management by telemedicine and the availability of in person appointments. The patient expressed understanding and agreed to proceed.      I discussed the assessment and treatment plan with the patient. The patient was provided an opportunity to ask questions and all were answered. The patient agreed with the plan and demonstrated an understanding of the instructions.   The patient was advised to call back or seek an in-person evaluation if the symptoms worsen or if the condition fails to improve as anticipated.  I provided 45 minutes of non-face-to-face time during this encounter.   Weber Cooks, LCSW   Participation Level: Active  Behavioral Response: CasualAlertAnxious and Depressed  Type of Therapy: Individual Therapy  Treatment Goals addressed:  Active     OP Depression     LTG: Reduce frequency, intensity, and duration of depression symptoms so that daily functioning is improved     Start:  08/08/23         STG: Bleu will attend at least 80% of scheduled family sessions      Start:  08/08/23         STG: Lotanna will reduce frequency of avoidant behaviors by 50% as evidenced by self-report in therapy sessions     Start:  08/08/23         Work with [name] to track symptoms, triggers, and/or skill use through a mood chart, diary card, or journal     Start:  08/08/23         Encourage Jefry to participate in recovery peer support activities weekly      Start:  08/08/23         Provide [name] educational information and reading material on dissociation, its causes, and symptoms     Start:  08/08/23         Work  with [name] to identify the major components of a recent episode of depression: physical symptoms, major thoughts and images, and major behaviors they experienced     Start:  08/08/23            ProgressTowards Goals: Progressing  Interventions: CBT, Motivational Interviewing, and Supportive  Suicidal/Homicidal: Nowithout intent/plan  Therapist Response:     Patient was alert and oriented x 5.  Joseluis was pleasant and cooperative, maintained good eye contact.  He engaged well in therapy session and was dressed casually.  He presented today with anxious and depressed mood\affect.  Patient reports primary stressors of sobriety being in school.  He reports over the last 30 days he has been abstaining from alcohol.  He reports that alcohol became a problem as he started to utilize it as a distraction or to help him sleep.  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  Patient reports that he needs to get his dissertation done as soon as possible.  Intervention/plan: LCSW utilized motivational interviewing for open-ended questions, reflective listening, and positive affirmations.  LCSW educated patient on partilization and how it can help decrease anxiety.  LCSW educated patient on conditioning starting out with less intense schools and building up to stronger goals.  LCSW educated patient on the negative side  effects of alcohol.  LCSW educated patient on exercise to help decrease anxiety.  LCSW supportive therapy for praise and encouragement.  Plan: Return again in 5 weeks.  Diagnosis: Generalized anxiety disorder  Moderate episode of recurrent major depressive disorder (HCC)  Attention deficit hyperactivity disorder (ADHD), combined type  Collaboration of Care: Other None today   Patient/Guardian was advised Release of Information must be obtained prior to any record release in order to collaborate their care with an outside provider. Patient/Guardian was advised if they  have not already done so to contact the registration department to sign all necessary forms in order for [place] to release information regarding their care.   Consent: Patient/Guardian gives verbal consent for treatment and assignment of benefits for services provided during this visit. Patient/Guardian expressed understanding and agreed to proceed.   Weber Cooks, LCSW 08/08/2023

## 2023-08-23 DIAGNOSIS — Z419 Encounter for procedure for purposes other than remedying health state, unspecified: Secondary | ICD-10-CM | POA: Diagnosis not present

## 2023-09-12 ENCOUNTER — Ambulatory Visit (HOSPITAL_COMMUNITY): Payer: BC Managed Care – PPO | Admitting: Licensed Clinical Social Worker

## 2023-09-12 DIAGNOSIS — F331 Major depressive disorder, recurrent, moderate: Secondary | ICD-10-CM

## 2023-09-12 DIAGNOSIS — F411 Generalized anxiety disorder: Secondary | ICD-10-CM | POA: Diagnosis not present

## 2023-09-12 NOTE — Progress Notes (Signed)
THERAPIST PROGRESS NOTE  Virtual Visit via Video Note  I connected [name] on 09/12/23 at  8:00 AM EST by a video enabled telemedicine application and verified that I am speaking with the correct person using two identifiers.  Location: Patient: [facility]  Provider: [facility]    I discussed the limitations of evaluation and management by telemedicine and the availability of in person appointments. The patient expressed understanding and agreed to proceed.      I discussed the assessment and treatment plan with the patient. The patient was provided an opportunity to ask questions and all were answered. The patient agreed with the plan and demonstrated an understanding of the instructions.   The patient was advised to call back or seek an in-person evaluation if the symptoms worsen or if the condition fails to improve as anticipated.  I provided 45 minutes of non-face-to-face time during this encounter.   Weber Cooks, LCSW   Participation Level: Active  Behavioral Response: CasualAlertAnxious and Depressed  Type of Therapy: Individual Therapy  Treatment Goals addressed:  Active     OP Depression     LTG: Reduce frequency, intensity, and duration of depression symptoms so that daily functioning is improved (Progressing)     Start:  08/08/23    Expected End:  01/23/24         STG: [name] will attend at least 80% of scheduled family sessions  (Progressing)     Start:  08/08/23    Expected End:  01/23/24         STG: [name] will reduce frequency of avoidant behaviors by 50% as evidenced by self-report in therapy sessions (Progressing)     Start:  08/08/23    Expected End:  01/23/24         Work with [name] to track symptoms, triggers, and/or skill use through a mood chart, diary card, or journal     Start:  08/08/23         Encourage Tracy Robinson to participate in recovery peer support activities weekly      Start:  08/08/23         Provide [name]  educational information and reading material on dissociation, its causes, and symptoms     Start:  08/08/23         Work with [name] to identify the major components of a recent episode of depression: physical symptoms, major thoughts and images, and major behaviors they experienced     Start:  08/08/23            ProgressTowards Goals: Progressing  Interventions: CBT, Motivational Interviewing, and Supportive   Suicidal/Homicidal: Nowithout intent/plan  Therapist Response:   [name] was alert and oriented x 5.  He was pleasant, cooperative, maintained good eye contact.  He engaged well in therapy session was dressed casually.  He presented today with anxious and depressed mood\affect.  Patient comes in today with primary stressors as sobriety.  He reports that overall his medication has helped suppressed his urge to drink.  Patient reports when he gets stressed though he does not take his medication for 24 hours and then drinks alcohol.  Patient reports 2 episodes in the past 3 weeks that this has happened.  [name] reports that he wants to stop drinking and reports overall frustration and himself.  He states that this is due to fighting "too many battles".  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  Interventions/plan: LCSW  utilized motivational interviewing for reflective listening, open-ended questions, and positive affirmations.  LCSW used supportive therapy for praise and encouragement.  This is as evidenced by encouraging patient to attend weekly alcohol Anonymous meetings to build up his support system for his recovery with alcohol.  LCSW educated on prioritization and partialization to help decrease his anxiety and depression.  LCSW educated patient on taking medications as prescribed and consistently for best overall outcomes.      Plan: Return again in 5 weeks.  Diagnosis: Generalized anxiety disorder  Moderate  episode of recurrent major depressive disorder (HCC)  Collaboration of Care: Other None today   Patient/Guardian was advised Release of Information must be obtained prior to any record release in order to collaborate their care with an outside provider. Patient/Guardian was advised if they have not already done so to contact the registration department to sign all necessary forms in order for [place] to release information regarding their care.   Consent: Patient/Guardian gives verbal consent for treatment and assignment of benefits for services provided during this visit. Patient/Guardian expressed understanding and agreed to proceed.   Weber Cooks, LCSW 09/12/2023

## 2023-09-16 ENCOUNTER — Telehealth (HOSPITAL_COMMUNITY): Payer: Self-pay

## 2023-09-16 NOTE — Telephone Encounter (Signed)
 Hello,    Pt called today asking for a refill of amphetamine-dextroamphetamine (ADDERALL XR) 30 MG 24 hr capsule. Was not sure whether to send this to you or Dr. Adrian Blackwater.    Last seen on 07/23/2022 Next app is 3/6

## 2023-09-20 DIAGNOSIS — Z419 Encounter for procedure for purposes other than remedying health state, unspecified: Secondary | ICD-10-CM | POA: Diagnosis not present

## 2023-09-25 ENCOUNTER — Encounter (HOSPITAL_COMMUNITY): Payer: Self-pay | Admitting: Student

## 2023-09-25 ENCOUNTER — Ambulatory Visit (INDEPENDENT_AMBULATORY_CARE_PROVIDER_SITE_OTHER): Payer: BC Managed Care – PPO | Admitting: Student

## 2023-09-25 ENCOUNTER — Encounter (HOSPITAL_COMMUNITY): Payer: BC Managed Care – PPO | Admitting: Student

## 2023-09-25 DIAGNOSIS — F411 Generalized anxiety disorder: Secondary | ICD-10-CM

## 2023-09-25 DIAGNOSIS — F909 Attention-deficit hyperactivity disorder, unspecified type: Secondary | ICD-10-CM | POA: Diagnosis not present

## 2023-09-25 DIAGNOSIS — F331 Major depressive disorder, recurrent, moderate: Secondary | ICD-10-CM | POA: Diagnosis not present

## 2023-09-25 MED ORDER — AMPHETAMINE-DEXTROAMPHET ER 25 MG PO CP24: 25.0000 mg | ORAL_CAPSULE | Freq: Every day | ORAL | 0 refills | Status: DC

## 2023-09-25 MED ORDER — BUSPIRONE HCL 10 MG PO TABS: 10.0000 mg | ORAL_TABLET | Freq: Two times a day (BID) | ORAL | 1 refills | Status: DC

## 2023-09-25 MED ORDER — NALTREXONE HCL 50 MG PO TABS: 50.0000 mg | ORAL_TABLET | Freq: Every day | ORAL | 1 refills | Status: AC

## 2023-09-25 MED ORDER — SERTRALINE HCL 100 MG PO TABS: 200.0000 mg | ORAL_TABLET | Freq: Every day | ORAL | 0 refills | Status: DC

## 2023-09-25 MED ORDER — LAMOTRIGINE 25 MG PO TABS
25.0000 mg | ORAL_TABLET | Freq: Every day | ORAL | 1 refills | Status: DC
Start: 2023-09-25 — End: 2023-12-30
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
## 2023-10-20 ENCOUNTER — Ambulatory Visit (HOSPITAL_COMMUNITY): Payer: BC Managed Care – PPO | Admitting: Licensed Clinical Social Worker

## 2023-10-20 DIAGNOSIS — F411 Generalized anxiety disorder: Secondary | ICD-10-CM

## 2023-10-20 DIAGNOSIS — F1910 Other psychoactive substance abuse, uncomplicated: Secondary | ICD-10-CM | POA: Insufficient documentation

## 2023-10-20 DIAGNOSIS — F331 Major depressive disorder, recurrent, moderate: Secondary | ICD-10-CM

## 2023-10-20 NOTE — Progress Notes (Signed)
 THERAPIST PROGRESS NOTE  Virtual Visit via Video Note  I connected [name] on 10/20/23 at  3:00 PM EDT by a video enabled telemedicine application and verified that I am speaking with the correct person using two identifiers.  Location: Patient: Tracy Robinson  Provider: Providers Home    I discussed the limitations of evaluation and management by telemedicine and the availability of in person appointments. The patient expressed understanding and agreed to proceed.     I discussed the assessment and treatment plan with the patient. The patient was provided an opportunity to ask questions and all were answered. The patient agreed with the plan and demonstrated an understanding of the instructions.   The patient was advised to call back or seek an in-person evaluation if the symptoms worsen or if the condition fails to improve as anticipated.  I provided 30 minutes of non-face-to-face time during this encounter.   Tracy Cooks, Tracy Robinson   Participation Level: Active  Behavioral Response: CasualAlertAnxious and Depressed  Type of Therapy: Individual Therapy  Treatment Goals addressed:  Active     OP Depression     LTG: Reduce frequency, intensity, and duration of depression symptoms so that daily functioning is improved (Progressing)     Start:  08/08/23    Expected End:  01/23/24         STG: Tracy Robinson will attend at least 80% of scheduled family sessions  (Progressing)     Start:  08/08/23    Expected End:  01/23/24         STG: Tracy Robinson will reduce frequency of avoidant behaviors by 50% as evidenced by self-report in therapy sessions (Progressing)     Start:  08/08/23    Expected End:  01/23/24         Work [name] to identify the major components of a recent episode of depression: physical symptoms, major thoughts and images, and major behaviors they experienced     Start:  08/08/23         Work [name] to track symptoms, triggers, and/or skill use  through a mood chart, diary card, or journal (Completed)     Start:  08/08/23    End:  10/20/23      Encourage Tracy Robinson to participate in recovery peer support activities weekly  (Completed)     Start:  08/08/23    End:  10/20/23      Provide Tracy Robinson educational information and reading material on dissociation, its causes, and symptoms (Completed)     Start:  08/08/23    End:  10/20/23         ProgressTowards Goals: Progressing  Interventions: CBT, Motivational Interviewing, and Supportive   Suicidal/Homicidal: Nowithout intent/plan  Therapist Response:      Tracy Robinson was alert and oriented x 5.  He was pleasant, cooperative, maintained good eye contact.  He engaged well in therapy session and was dressed casually.  Tracy Robinson presented today with depressed and anxious mood\affect.  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  He reports that he has been struggling with an alcohol addiction for multiple years.  Tracy Robinson reports that this runs in the family.  Tracy Robinson states today that he "fell off the wagon in mid March".  He reports that this was due to him playing video games with his friends and then going out and getting some years.  Patient reports after that he started going to the store\gas station multiple days in a row to  get out.  He reports that this is a barrier for him completing his dissertation and completing his roles and responsibilities in his daily life.  Tracy Robinson states today that he is embarrassed due to his relapse.  He reports symptoms for tension, worry, worthlessness, hopelessness, and fatigue.  Tracy Robinson states that he needs to attend alcohol Anonymous meetings and remind himself of the support systems that he can have and also the reasons why alcohol is a barrier in his day-to-day life.  Intervention/plan: Tracy Robinson utilized psychoanalytic therapy for patient to express thoughts, feelings and emotions and nonjudgmental environment.  Tracy Robinson utilized supportive therapy for  praise and encouragement.  Tracy Robinson utilize person Center therapy for empowerment.  Tracy Robinson educated patient on utilizing the pros and cons list to remind himself of the reasons to not drink alcohol or abstained from it.  Tracy Robinson utilized motivational interviewing for open-ended questions, reflective listening, and positive affirmation.  Plan: Return again in 4 weeks.  Diagnosis: Polysubstance abuse (HCC)  Moderate episode of recurrent major depressive disorder (HCC)  Generalized anxiety disorder  Collaboration of Care: Other None today   Patient/Guardian was advised Release of Information must be obtained prior to any record release in order to collaborate their care with an outside provider. Patient/Guardian was advised if they have not already done so to contact the registration department to sign all necessary forms in order for [place] to release information regarding their care.   Consent: Patient/Guardian gives verbal consent for treatment and assignment of benefits for services provided during this visit. Patient/Guardian expressed understanding and agreed to proceed.   Tracy Cooks, Tracy Robinson 10/20/2023

## 2023-10-28 ENCOUNTER — Ambulatory Visit (INDEPENDENT_AMBULATORY_CARE_PROVIDER_SITE_OTHER): Admitting: Student

## 2023-10-28 DIAGNOSIS — F331 Major depressive disorder, recurrent, moderate: Secondary | ICD-10-CM

## 2023-10-28 DIAGNOSIS — F909 Attention-deficit hyperactivity disorder, unspecified type: Secondary | ICD-10-CM

## 2023-10-28 DIAGNOSIS — F411 Generalized anxiety disorder: Secondary | ICD-10-CM
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
## 2023-10-29 ENCOUNTER — Telehealth (HOSPITAL_COMMUNITY): Payer: Self-pay | Admitting: *Deleted

## 2023-10-29 MED ORDER — AMPHETAMINE-DEXTROAMPHET ER 25 MG PO CP24: 25.0000 mg | ORAL_CAPSULE | Freq: Every day | ORAL | 0 refills | Status: DC

## 2023-10-29 NOTE — Telephone Encounter (Signed)
 Patient called asking why his Adderall was not refilled after his visit with his provider. States all his medication was sent to his pharmacy but that one. Chart reviewed, note states that patient had decrease in Adderall but nothing about discontinuation. Message sent to provider to review.

## 2023-10-30 NOTE — Telephone Encounter (Signed)
 RX was sent in, thanks.

## 2023-11-01 DIAGNOSIS — Z419 Encounter for procedure for purposes other than remedying health state, unspecified: Secondary | ICD-10-CM | POA: Diagnosis not present

## 2023-11-10 ENCOUNTER — Encounter (HOSPITAL_COMMUNITY): Payer: Self-pay

## 2023-11-10 ENCOUNTER — Ambulatory Visit (HOSPITAL_COMMUNITY): Payer: BC Managed Care – PPO | Admitting: Licensed Clinical Social Worker

## 2023-12-01 ENCOUNTER — Ambulatory Visit (HOSPITAL_COMMUNITY): Admitting: Licensed Clinical Social Worker

## 2023-12-01 DIAGNOSIS — F411 Generalized anxiety disorder: Secondary | ICD-10-CM

## 2023-12-01 DIAGNOSIS — Z419 Encounter for procedure for purposes other than remedying health state, unspecified: Secondary | ICD-10-CM | POA: Diagnosis not present

## 2023-12-01 DIAGNOSIS — F331 Major depressive disorder, recurrent, moderate: Secondary | ICD-10-CM

## 2023-12-01 NOTE — Progress Notes (Signed)
 THERAPIST PROGRESS NOTE  Virtual Visit via Video Note  I connected [name] on 12/01/23 at  3:00 PM EDT by a video enabled telemedicine application and verified that I am speaking with the correct person using two identifiers.  Location: Patient: [facility]  Provider: Providers Home    I discussed the limitations of evaluation and management by telemedicine and the availability of in person appointments. The patient expressed understanding and agreed to proceed.     I discussed the assessment and treatment plan with the patient. The patient was provided an opportunity to ask questions and all were answered. The patient agreed with the plan and demonstrated an understanding of the instructions.   The patient was advised to call back or seek an in-person evaluation if the symptoms worsen or if the condition fails to improve as anticipated.  I provided 45 minutes of non-face-to-face time during this encounter.   Maryagnes Small, LCSW   Participation Level: Active  Behavioral Response: CasualAlertAnxious and Depressed  Type of Therapy: Individual Therapy  Treatment Goals addressed:  Active     OP Depression     LTG: Reduce frequency, intensity, and duration of depression symptoms so that daily functioning is improved (Progressing)     Start:  08/08/23    Expected End:  01/23/24         STG: Tracy Robinson will attend at least 80% of scheduled family sessions  (Progressing)     Start:  08/08/23    Expected End:  01/23/24         STG: Tracy Robinson will reduce frequency of avoidant behaviors by 50% as evidenced by self-report in therapy sessions (Progressing)     Start:  08/08/23    Expected End:  01/23/24         Work [name] to track symptoms, triggers, and/or skill use through a mood chart, diary card, or journal (Completed)     Start:  08/08/23    End:  10/20/23      Encourage Tracy Robinson to participate in recovery peer support activities weekly  (Completed)     Start:   08/08/23    End:  10/20/23      Provide Tracy Robinson educational information and reading material on dissociation, its causes, and symptoms (Completed)     Start:  08/08/23    End:  10/20/23      Work [name] to identify the major components of a recent episode of depression: physical symptoms, major thoughts and images, and major behaviors they experienced (Completed)     Start:  08/08/23    End:  12/01/23         ProgressTowards Goals: Progressing  Interventions: CBT, Motivational Interviewing, and Supportive   Suicidal/Homicidal: Nowithout intent/plan  Therapist Response:   Tracy Robinson was alert and oriented x 5.  He was pleasant, cooperative, maintained good eye contact.  He engaged well in therapy session and was dressed casually.  He presented today with anxious and depressed mood\affect.  Patient reports primary stressors as physical appearance.  He reports that he is 30 pounds overweight but he would like to be at.  He reports that he has decreased his drinking and figured that that would help lose weight but it has not been as beneficial as he has thought.  He reports that the weight still remains and he does not know what to do.  LCSW and patient spoke today about a calorie deficit versus a calorie surplus.  LCSW educated patient on the  use of condiments and how those can add up in calories.  LCSW educated patient on empty calories such as soda and juices.  LCSW educated patient on proper portion sizes and utilization of a food scale.  LCSW utilized reframing to speak with patient about a healthy weight versus a goal weight.  LCSW educated patient on consulting with his doctor about healthy weights that he should aim for.  LCSW educated patient on how stress can affect weight loss    Plan: Return again in 4 weeks.  Diagnosis: Generalized anxiety disorder  Moderate episode of recurrent major depressive disorder (HCC)  Collaboration of Care: Other none today   Patient/Guardian was  advised Release of Information must be obtained prior to any record release in order to collaborate their care with an outside provider. Patient/Guardian was advised if they have not already done so to contact the registration department to sign all necessary forms in order for us  to release information regarding their care.   Consent: Patient/Guardian gives verbal consent for treatment and assignment of benefits for services provided during this visit. Patient/Guardian expressed understanding and agreed to proceed.   Maryagnes Small, LCSW 12/01/2023

## 2023-12-03 ENCOUNTER — Telehealth (HOSPITAL_COMMUNITY): Payer: Self-pay | Admitting: *Deleted

## 2023-12-03 ENCOUNTER — Other Ambulatory Visit (HOSPITAL_COMMUNITY): Payer: Self-pay | Admitting: Student

## 2023-12-03 DIAGNOSIS — F909 Attention-deficit hyperactivity disorder, unspecified type: Secondary | ICD-10-CM

## 2023-12-03 MED ORDER — AMPHETAMINE-DEXTROAMPHET ER 25 MG PO CP24: 25.0000 mg | ORAL_CAPSULE | Freq: Every day | ORAL | 0 refills | Status: DC

## 2023-12-03 NOTE — Telephone Encounter (Signed)
 This is done, thanks.   Dr. Lorna Rose

## 2023-12-03 NOTE — Telephone Encounter (Signed)
 Patient called asking for refill of Adderall. Next appointment 5/20. States he took his last one today and was asking for it to be done ASAP,  explained that most refills will be filled within 24-48 hours. Asked that he give more of an advanced notice in the future.

## 2023-12-09 ENCOUNTER — Ambulatory Visit (INDEPENDENT_AMBULATORY_CARE_PROVIDER_SITE_OTHER): Admitting: Student

## 2023-12-09 DIAGNOSIS — F330 Major depressive disorder, recurrent, mild: Secondary | ICD-10-CM | POA: Diagnosis not present

## 2023-12-09 DIAGNOSIS — F909 Attention-deficit hyperactivity disorder, unspecified type: Secondary | ICD-10-CM | POA: Diagnosis not present

## 2023-12-09 DIAGNOSIS — F1020 Alcohol dependence, uncomplicated: Secondary | ICD-10-CM | POA: Diagnosis not present

## 2023-12-09 DIAGNOSIS — F411 Generalized anxiety disorder: Secondary | ICD-10-CM | POA: Diagnosis not present

## 2023-12-09 MED ORDER — AMPHETAMINE-DEXTROAMPHET ER 20 MG PO CP24
20.0000 mg | ORAL_CAPSULE | Freq: Every day | ORAL | 0 refills | Status: AC
Start: 2023-12-09 — End: 2024-01-08

## 2023-12-09 MED ORDER — BUSPIRONE HCL 10 MG PO TABS: 10.0000 mg | ORAL_TABLET | Freq: Two times a day (BID) | ORAL | 1 refills | Status: DC

## 2023-12-09 MED ORDER — NALTREXONE HCL 50 MG PO TABS: 100.0000 mg | ORAL_TABLET | Freq: Every day | ORAL | 1 refills | Status: DC
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
## 2023-12-15 ENCOUNTER — Encounter (HOSPITAL_COMMUNITY): Payer: Self-pay | Admitting: Student

## 2023-12-15 DIAGNOSIS — F1020 Alcohol dependence, uncomplicated: Secondary | ICD-10-CM | POA: Insufficient documentation

## 2023-12-29 ENCOUNTER — Telehealth (HOSPITAL_COMMUNITY): Payer: Self-pay

## 2023-12-29 NOTE — Telephone Encounter (Signed)
 Hello,   Pt/Pharmacy is requesting for a refill of Lamotrigine , not sure if this has been sent in to [facility] pharmacy.     JNL

## 2023-12-30 ENCOUNTER — Other Ambulatory Visit (HOSPITAL_COMMUNITY): Payer: Self-pay | Admitting: Student

## 2023-12-30 DIAGNOSIS — F331 Major depressive disorder, recurrent, moderate: Secondary | ICD-10-CM

## 2023-12-30 MED ORDER — LAMOTRIGINE 25 MG PO TABS
25.0000 mg | ORAL_TABLET | Freq: Every day | ORAL | 1 refills | Status: DC
Start: 2023-12-30 — End: 2024-01-29

## 2023-12-30 NOTE — Telephone Encounter (Signed)
 This is done.  Dr. Lorna Rose

## 2024-01-01 ENCOUNTER — Ambulatory Visit (HOSPITAL_COMMUNITY): Admitting: Licensed Clinical Social Worker

## 2024-01-01 ENCOUNTER — Telehealth (HOSPITAL_COMMUNITY): Payer: Self-pay

## 2024-01-01 DIAGNOSIS — F331 Major depressive disorder, recurrent, moderate: Secondary | ICD-10-CM

## 2024-01-01 DIAGNOSIS — F902 Attention-deficit hyperactivity disorder, combined type: Secondary | ICD-10-CM | POA: Diagnosis not present

## 2024-01-01 DIAGNOSIS — Z419 Encounter for procedure for purposes other than remedying health state, unspecified: Secondary | ICD-10-CM | POA: Diagnosis not present

## 2024-01-01 DIAGNOSIS — F411 Generalized anxiety disorder: Secondary | ICD-10-CM

## 2024-01-01 NOTE — Telephone Encounter (Signed)
 He has an appointment with me on 6/17. He is not expected to be out of Adderall until 6/19, so we will discuss the medication during our appointment. Thank you!  Dr. Lorna Rose

## 2024-01-01 NOTE — Telephone Encounter (Signed)
 Hello,   Pt is requesting for a refill of adderall to be sent to on file pharmacy. Please let me know if pt needs an APP in order for this refill.    JNL

## 2024-01-01 NOTE — Progress Notes (Signed)
 THERAPIST PROGRESS NOTE  Virtual Visit via Video Note  I connected [name] on 01/01/24 at  3:00 PM EDT by a video enabled telemedicine application and verified that I am speaking with the correct person using two identifiers.  Location: Patient: [facility]  Provider: Providers Home    I discussed the limitations of evaluation and management by telemedicine and the availability of in person appointments. The patient expressed understanding and agreed to proceed.    I discussed the assessment and treatment plan with the patient. The patient was provided an opportunity to ask questions and all were answered. The patient agreed with the plan and demonstrated an understanding of the instructions.   The patient was advised to call back or seek an in-person evaluation if the symptoms worsen or if the condition fails to improve as anticipated.  I provided 55 minutes of non-face-to-face time during this encounter.   Maryagnes Small, Tracy Robinson   Participation Level: Active  Behavioral Response: CasualAlertAnxious and Depressed  Type of Therapy: Individual Therapy  Treatment Goals addressed:  Active     OP Depression     LTG: Reduce frequency, intensity, and duration of depression symptoms so that daily functioning is improved (Progressing)     Start:  08/08/23    Expected End:  01/23/24         STG: Tracy Robinson will attend at least 80% of scheduled family sessions  (Progressing)     Start:  08/08/23    Expected End:  01/23/24         STG: Tracy Robinson will reduce frequency of avoidant behaviors by 50% as evidenced by self-report in therapy sessions (Progressing)     Start:  08/08/23    Expected End:  01/23/24         Work [name] to track symptoms, triggers, and/or skill use through a mood chart, diary card, or journal (Completed)     Start:  08/08/23    End:  10/20/23      Encourage Tracy Robinson to participate in recovery peer support activities weekly  (Completed)     Start:   08/08/23    End:  10/20/23      Provide Tracy Robinson educational information and reading material on dissociation, its causes, and symptoms (Completed)     Start:  08/08/23    End:  10/20/23      Work [name] to identify the major components of a recent episode of depression: physical symptoms, major thoughts and images, and major behaviors they experienced (Completed)     Start:  08/08/23    End:  12/01/23        ProgressTowards Goals: Progressing  Interventions: CBT, Motivational Interviewing, and Supportive   Suicidal/Homicidal: Nowithout intent/plan  Therapist Response:     Tracy Robinson was alert and oriented x 5.  He was pleasant and cooperative in therapy session.  Patient engaged well and was dressed casually.  Tracy Robinson presented with anxious mood and appropriate affect.  Patient comes in with primary stressors dissertation.  Tracy Robinson reports low motivation as he has been working on this for 5 years.  Tracy Robinson states fatigue due to working on it for this long.  He also reports pressure and tension due to being on a timeline of 6 weeks.  Tracy Robinson understands that he has the capability to get this done.  Tracy Robinson believes that some of this stems from his burnout and taking a year off.  Patient continues to engage in hobbies such as video games and  running.  He states that he is attempting to eat healthier so that he can lose weight.  Tracy Robinson's overall focus is on his dissertation and maintaining his mental health.  Intervention/plan: Tracy Robinson utilized psychoanalytic therapy for patient to express thoughts, feelings and emotions  Tracy Robinson utilized supportive therapy for praise and encouragement.  Tracy Robinson utilized motivational interviewing for open-ended questions, reflective listening and positive affirmations.  Tracy Robinson utilize cognitive behavioral therapy for cognitive restructuring and reframing.    Tracy Robinson utilized person centered therapy for unconditional positive regard and empowerment.  Plan for patient will be to  continue to work on dissertation by the use of partilization to help decrease tension, worry, and feelings of being overwhelmed. Plan: Return again in 3 weeks.  Diagnosis: Attention deficit hyperactivity disorder (ADHD), combined type  Generalized anxiety disorder  Moderate episode of recurrent major depressive disorder (HCC)  Collaboration of Care: Other None today   Patient/Guardian was advised Release of Information must be obtained prior to any record release in order to collaborate their care with an outside provider. Patient/Guardian was advised if they have not already done so to contact the registration department to sign all necessary forms in order for us  to release information regarding their care.   Consent: Patient/Guardian gives verbal consent for treatment and assignment of benefits for services provided during this visit. Patient/Guardian expressed understanding and agreed to proceed.   Maryagnes Small, Tracy Robinson 01/01/2024

## 2024-01-01 NOTE — Telephone Encounter (Signed)
 Hello,   I called the [facility] pharmacy and spoke to pharmacist she states that they have never received a script for Adderall.  Then she re- ran something on her end and it went through PT was able to pick up adderall she states. And I strongly encouraged PT to follow up with Med management with you.    JNL

## 2024-01-06 ENCOUNTER — Ambulatory Visit (INDEPENDENT_AMBULATORY_CARE_PROVIDER_SITE_OTHER): Admitting: Student

## 2024-01-06 VITALS — BP 144/91 | HR 82 | Ht 68.0 in | Wt 191.6 lb

## 2024-01-06 DIAGNOSIS — F1020 Alcohol dependence, uncomplicated: Secondary | ICD-10-CM | POA: Diagnosis not present

## 2024-01-06 DIAGNOSIS — F902 Attention-deficit hyperactivity disorder, combined type: Secondary | ICD-10-CM

## 2024-01-06 DIAGNOSIS — F411 Generalized anxiety disorder: Secondary | ICD-10-CM

## 2024-01-06 DIAGNOSIS — F331 Major depressive disorder, recurrent, moderate: Secondary | ICD-10-CM | POA: Diagnosis not present
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
## 2024-01-07 ENCOUNTER — Encounter (HOSPITAL_COMMUNITY): Payer: Self-pay | Admitting: Student

## 2024-01-12 MED ORDER — SERTRALINE HCL 100 MG PO TABS: 200.0000 mg | ORAL_TABLET | Freq: Every day | ORAL | 0 refills | Status: DC
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
## 2024-01-29 ENCOUNTER — Ambulatory Visit (HOSPITAL_COMMUNITY): Admitting: Licensed Clinical Social Worker

## 2024-01-29 ENCOUNTER — Ambulatory Visit (INDEPENDENT_AMBULATORY_CARE_PROVIDER_SITE_OTHER): Admitting: Psychiatry

## 2024-01-29 VITALS — BP 138/95 | HR 91 | Wt 194.6 lb

## 2024-01-29 DIAGNOSIS — F1020 Alcohol dependence, uncomplicated: Secondary | ICD-10-CM

## 2024-01-29 DIAGNOSIS — F331 Major depressive disorder, recurrent, moderate: Secondary | ICD-10-CM

## 2024-01-29 DIAGNOSIS — F17200 Nicotine dependence, unspecified, uncomplicated: Secondary | ICD-10-CM | POA: Insufficient documentation

## 2024-01-29 DIAGNOSIS — F1021 Alcohol dependence, in remission: Secondary | ICD-10-CM

## 2024-01-29 DIAGNOSIS — Z8659 Personal history of other mental and behavioral disorders: Secondary | ICD-10-CM | POA: Insufficient documentation

## 2024-01-29 DIAGNOSIS — F411 Generalized anxiety disorder: Secondary | ICD-10-CM

## 2024-01-29 MED ORDER — SERTRALINE HCL 100 MG PO TABS: 200.0000 mg | ORAL_TABLET | Freq: Every day | ORAL | 1 refills | Status: DC

## 2024-01-29 MED ORDER — LAMOTRIGINE 25 MG PO TABS: 50.0000 mg | ORAL_TABLET | Freq: Every day | ORAL | 1 refills | Status: DC

## 2024-01-29 MED ORDER — NICOTINE 14 MG/24HR TD PT24: 14.0000 mg | MEDICATED_PATCH | Freq: Every day | TRANSDERMAL | 1 refills | Status: AC

## 2024-01-29 MED ORDER — NALTREXONE HCL 50 MG PO TABS: 100.0000 mg | ORAL_TABLET | Freq: Every day | ORAL | 1 refills | Status: DC

## 2024-01-31 DIAGNOSIS — Z419 Encounter for procedure for purposes other than remedying health state, unspecified: Secondary | ICD-10-CM | POA: Diagnosis not present

## 2024-02-04 ENCOUNTER — Other Ambulatory Visit (INDEPENDENT_AMBULATORY_CARE_PROVIDER_SITE_OTHER)

## 2024-02-04 DIAGNOSIS — F330 Major depressive disorder, recurrent, mild: Secondary | ICD-10-CM | POA: Diagnosis not present

## 2024-02-04 DIAGNOSIS — Z79899 Other long term (current) drug therapy: Secondary | ICD-10-CM | POA: Diagnosis not present

## 2024-02-04 DIAGNOSIS — F331 Major depressive disorder, recurrent, moderate: Secondary | ICD-10-CM

## 2024-02-05 ENCOUNTER — Ambulatory Visit (HOSPITAL_COMMUNITY): Admitting: Licensed Clinical Social Worker

## 2024-02-05 DIAGNOSIS — F331 Major depressive disorder, recurrent, moderate: Secondary | ICD-10-CM

## 2024-02-05 DIAGNOSIS — F411 Generalized anxiety disorder: Secondary | ICD-10-CM | POA: Diagnosis not present

## 2024-02-05 DIAGNOSIS — F1020 Alcohol dependence, uncomplicated: Secondary | ICD-10-CM

## 2024-02-05 NOTE — Progress Notes (Signed)
 THERAPIST PROGRESS NOTE  Virtual Visit via Video Note  I connected [name] on 02/05/24 at  9:00 AM EDT by a video enabled telemedicine application and verified that I am speaking with the correct person using two identifiers.  Location: Patient: [facility]  Provider: Providers Home    I discussed the limitations of evaluation and management by telemedicine and the availability of in person appointments. The patient expressed understanding and agreed to proceed.    I discussed the assessment and treatment plan with the patient. The patient was provided an opportunity to ask questions and all were answered. The patient agreed with the plan and demonstrated an understanding of the instructions.   The patient was advised to call back or seek an in-person evaluation if the symptoms worsen or if the condition fails to improve as anticipated.  I provided 45 minutes of non-face-to-face time during this encounter.   Tracy GORMAN Patee, LCSW   Participation Level: Active  Behavioral Response: CasualAlertAnxious and Depressed  Type of Therapy: Individual Therapy  Treatment Goals addressed:  Active     OP Depression     LTG: Reduce frequency, intensity, and duration of depression symptoms so that daily functioning is improved (Progressing)     Start:  08/08/23    Expected End:  06/18/24         STG: Tracy will attend at least 80% of scheduled family sessions  (Progressing)     Start:  08/08/23    Expected End:  06/18/24         STG: Tracy will reduce frequency of avoidant behaviors by 50% as evidenced by self-report in therapy sessions (Progressing)     Start:  08/08/23    Expected End:  06/18/24         Work with Tracy to track symptoms, triggers, and/or skill use through a mood chart, diary card, or journal (Completed)     Start:  08/08/23    End:  10/20/23      Encourage Tracy to participate in recovery peer support activities weekly  (Completed)     Start:   08/08/23    End:  10/20/23      Provide Tracy educational information and reading material on dissociation, its causes, and symptoms (Completed)     Start:  08/08/23    End:  10/20/23      Work with Tracy to identify the major components of a recent episode of depression: physical symptoms, major thoughts and images, and major behaviors they experienced (Completed)     Start:  08/08/23    End:  12/01/23        ProgressTowards Goals: Progressing  Interventions: CBT, Motivational Interviewing, and Supportive  Suicidal/Homicidal: Nowithout intent/plan  Therapist Response:   Patient was alert and oriented x 5.  He was pleasant, cooperative, maintained good eye contact.  Patient engaged well in therapy session and was dressed casually.  He was pleasant, cooperative, maintained good eye contact.  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  This is a delay as he planned on doing it in August but the person he has to defend it to is on leave until September.  [name] states that he is struggling finding enjoyment and celebrating his accomplishments such as finishing a major part of his dissertation.  Stressor for patient include quitting nicotine , abstaining from alcohol, and physical health.  Tracy Robinson has been trying to decrease his  vaping and has started to utilize a patch for nicotine  which he reports has been helping.  He reports that he still struggles with alcohol use and will have binge drinking periods of 3 to 7 days and then abstain for several weeks.  Tracy Robinson would like to get back into running and finding a healthy diet.  Interventions/plan: LCSW educated patient on the benefits of a healthy diet such as balancing carbohydrates, fats, and proteins.  LCSW educated patient on the use of structure and scheduling to help decrease anxiety and depression.  LCSW educate patient on activities such as exercise, hobbies, and family  support to find enjoyment outside of work, school, and responsibilities at home.  LCSW utilized motivational interviewing for positive affirmations.  LCSW utilized supportive therapy for praise and encouragement.    Plan: Return again in 3 weeks.  Diagnosis: Generalized anxiety disorder  Moderate episode of recurrent major depressive disorder (HCC)  Alcohol use disorder, moderate, dependence (HCC)  Collaboration of Care: Other None today   Patient/Guardian was advised Release of Information must be obtained prior to any record release in order to collaborate their care with an outside provider. Patient/Guardian was advised if they have not already done so to contact the registration department to sign all necessary forms in order for us  to release information regarding their care.   Consent: Patient/Guardian gives verbal consent for treatment and assignment of benefits for services provided during this visit. Patient/Guardian expressed understanding and agreed to proceed.   Tracy GORMAN Patee, LCSW 02/05/2024

## 2024-02-12 ENCOUNTER — Ambulatory Visit (HOSPITAL_COMMUNITY): Payer: Self-pay | Admitting: Psychiatry

## 2024-02-12 LAB — SPECIMEN STATUS REPORT

## 2024-02-12 LAB — B12 AND FOLATE PANEL
Folate: >20 ng/mL (ref >3.0)
Vitamin B-12: 736 pg/mL (ref 232–1245)

## 2024-02-12 LAB — VITAMIN D 25 HYDROXY (VIT D DEFICIENCY, FRACTURES): Vit D, 25-Hydroxy: 32.6 ng/mL (ref 30.0–100.0)

## 2024-03-02 DIAGNOSIS — Z419 Encounter for procedure for purposes other than remedying health state, unspecified: Secondary | ICD-10-CM | POA: Diagnosis not present

## 2024-03-04 ENCOUNTER — Ambulatory Visit (INDEPENDENT_AMBULATORY_CARE_PROVIDER_SITE_OTHER): Admitting: Licensed Clinical Social Worker

## 2024-03-04 DIAGNOSIS — F331 Major depressive disorder, recurrent, moderate: Secondary | ICD-10-CM | POA: Diagnosis not present

## 2024-03-04 DIAGNOSIS — F1910 Other psychoactive substance abuse, uncomplicated: Secondary | ICD-10-CM

## 2024-03-04 DIAGNOSIS — F902 Attention-deficit hyperactivity disorder, combined type: Secondary | ICD-10-CM

## 2024-03-04 NOTE — Progress Notes (Signed)
 THERAPIST PROGRESS NOTE  Virtual Visit via Video Note  I connected [name] on 03/04/24 at  9:00 AM EDT by a video enabled telemedicine application and verified that I am speaking with the correct person using two identifiers.  Location: Patient: [facility]  Provider: Providers Home    I discussed the limitations of evaluation and management by telemedicine and the availability of in person appointments. The patient expressed understanding and agreed to proceed.     I discussed the assessment and treatment plan with the patient. The patient was provided an opportunity to ask questions and all were answered. The patient agreed with the plan and demonstrated an understanding of the instructions.   The patient was advised to call back or seek an in-person evaluation if the symptoms worsen or if the condition fails to improve as anticipated.  I provided 30 minutes of non-face-to-face time during this encounter.   Juliene GORMAN Patee, LCSW   Participation Level: Active  Behavioral Response: CasualAlertAnxious and Depressed  Type of Therapy: Individual Therapy  Treatment Goals addressed:  Active     OP Depression     LTG: Reduce frequency, intensity, and duration of depression symptoms so that daily functioning is improved (Progressing)     Start:  08/08/23    Expected End:  06/18/24         STG: Tracy will attend at least 80% of scheduled family sessions  (Progressing)     Start:  08/08/23    Expected End:  06/18/24         STG: Tracy will reduce frequency of avoidant behaviors by 50% as evidenced by self-report in therapy sessions (Progressing)     Start:  08/08/23    Expected End:  06/18/24         Work with Tracy to track symptoms, triggers, and/or skill use through a mood chart, diary card, or journal (Completed)     Start:  08/08/23    End:  10/20/23      Encourage Tracy to participate in recovery peer support activities weekly  (Completed)     Start:   08/08/23    End:  10/20/23      Provide Tracy educational information and reading material on dissociation, its causes, and symptoms (Completed)     Start:  08/08/23    End:  10/20/23      Work with Tracy to identify the major components of a recent episode of depression: physical symptoms, major thoughts and images, and major behaviors they experienced (Completed)     Start:  08/08/23    End:  12/01/23        ProgressTowards Goals: Progressing  Interventions: CBT, Motivational Interviewing, and Supportive   Suicidal/Homicidal: Nowithout intent/plan  Therapist Response:   Tracy Robinson was alert and oriented x 5.  He was pleasant, cooperative, maintained good eye contact.  Patient engaged well in therapy session was dressed casually.  He presented today with anxious mood\affect.  Patient reports primary stressor is regulating thoughts.  He reports that he has found a job part-time which has helped him financially and regulate his schedule during the day.  Tracy Robinson reports that the problem is when heating he tends to have free association with his thoughts and can be unproductive.  Tracy Robinson asked today about suggestions for regulating his thoughts and emotions utilizing coping skills.    LCSW encouraged patient to journal his thoughts and feelings on paper to better understand his triggers what his thoughts are and why  he is having them.  LCSW also encouraged patient to regulate by keeping a schedule even while he is at home utilizing task for daily chores, self-care, and running errands.    LCSW educated patient on the benefits of exercise such as utilization of endorphins.  LCSW educated patient on the use of a planner to help regulate his time well outside of work or working on his dissertation.  LCSW utilized supportive therapy for praise and encouragement.  LCSW utilized psychoanalytic therapy for patient to express thoughts, feelings and concerns and nonjudgmental environment.  LCSW validated feelings  and utilized of open-ended questions.  Plan: Return again in 8 weeks.  Diagnosis: Moderate episode of recurrent major depressive disorder (HCC)  Attention deficit hyperactivity disorder (ADHD), combined type  Polysubstance abuse (HCC)  Collaboration of Care: Other Continued individual therapy  Patient/Guardian was advised Release of Information must be obtained prior to any record release in order to collaborate their care with an outside provider. Patient/Guardian was advised if they have not already done so to contact the registration department to sign all necessary forms in order for us  to release information regarding their care.   Consent: Patient/Guardian gives verbal consent for treatment and assignment of benefits for services provided during this visit. Patient/Guardian expressed understanding and agreed to proceed.   Juliene GORMAN Patee, LCSW 03/04/2024

## 2024-03-14 IMAGING — MR MR PELVIS W/O CM
4 of 5 series · 19 of 48 positions shown · non-contrast
Comparison: None Available.

CLINICAL DATA: Right hip and groin pain.

EXAM:
MRI PELVIS WITHOUT CONTRAST
TECHNIQUE: Multiplanar multisequence MR imaging of the pelvis was performed. No
intravenous contrast was administered.

[Series 3: T1 · axial · 4.0mm · 0.59mm/px · z∈[-83,+76]mm · 8 of 40 slices shown (1 of 2)]
[im 1/40]
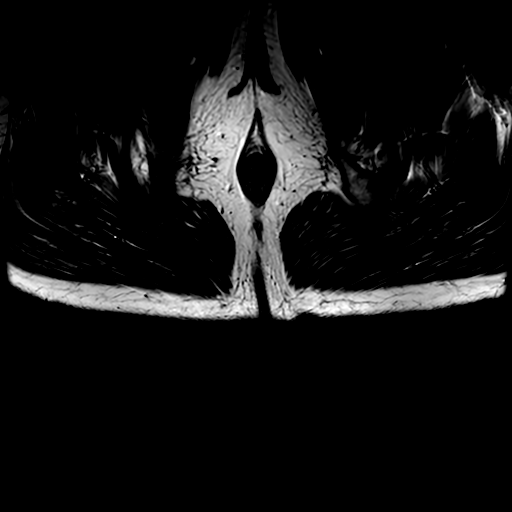
[im 5/40]
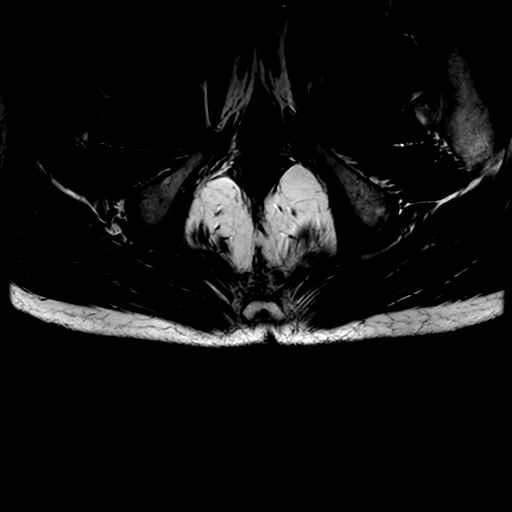
[im 14/40]
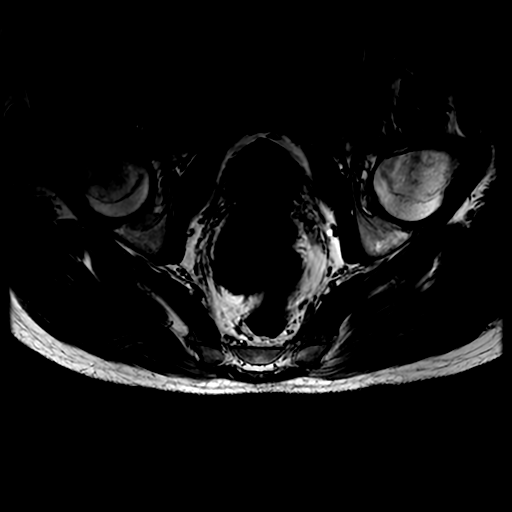
[im 18/40]
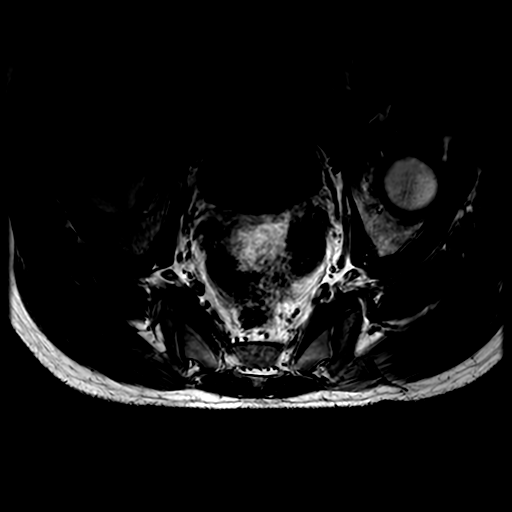
[im 22/40]
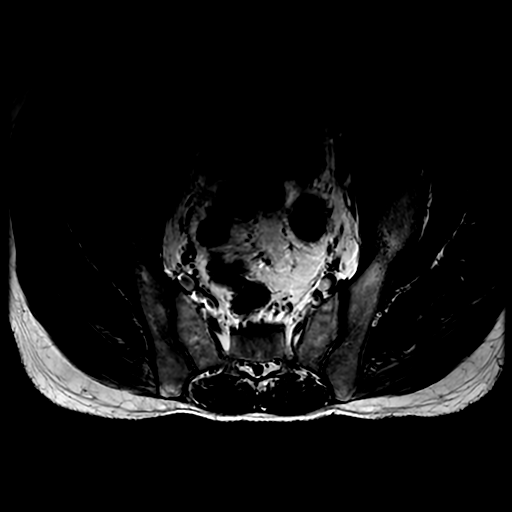
[im 27/40]
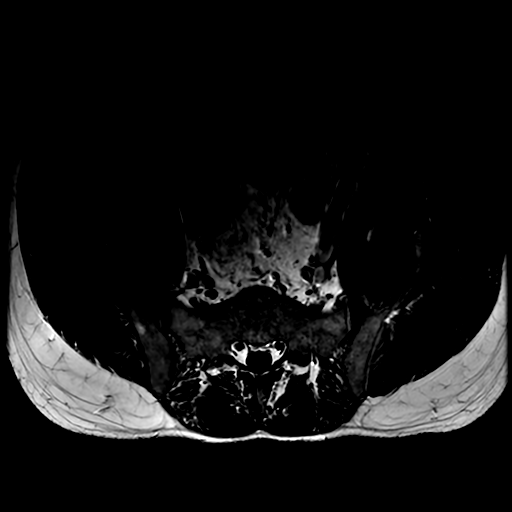
[im 35/40]
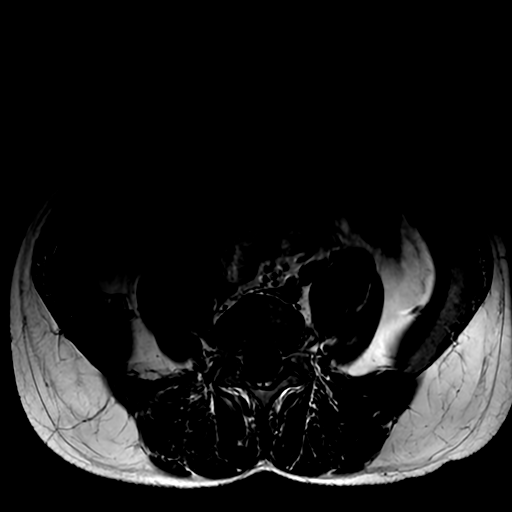
[im 40/40]
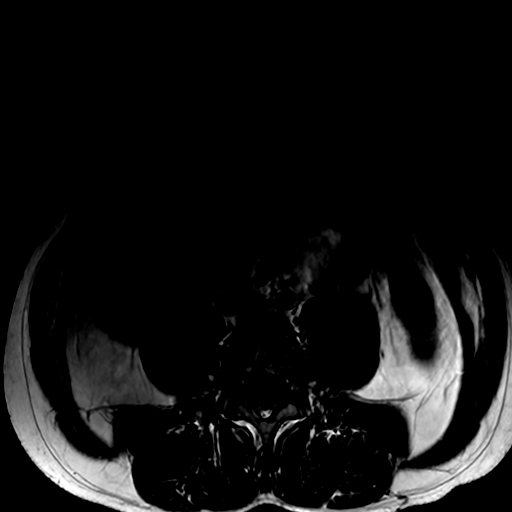

[Series 4: T2 fat-sat · axial · 4.0mm · 0.51mm/px · z∈[-59,+71]mm · 3 of 40 slices shown]
[im 4/40]
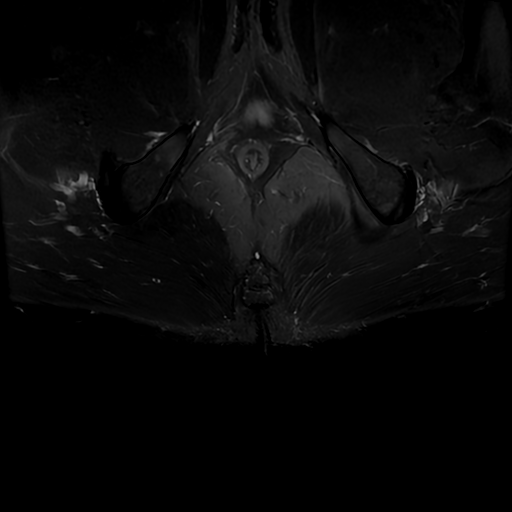
[im 20/40]
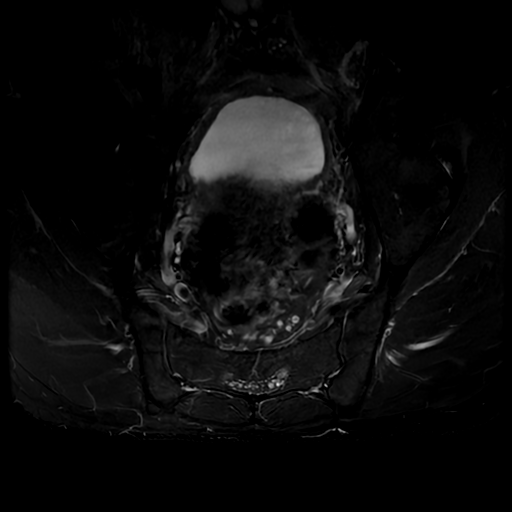
[im 36/40]
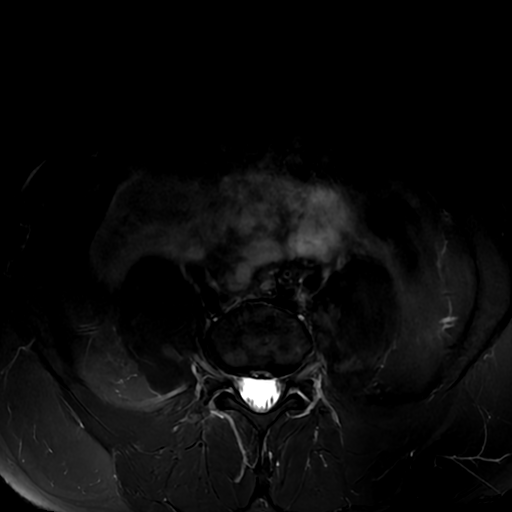

[Series 6: T1 · coronal · 3.0mm · 0.78mm/px · 5 of 29 slices shown (2 of 2)]
[im 1/29]
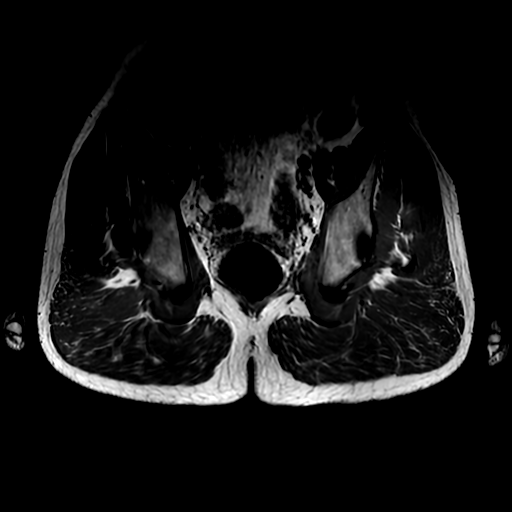
[im 5/29]
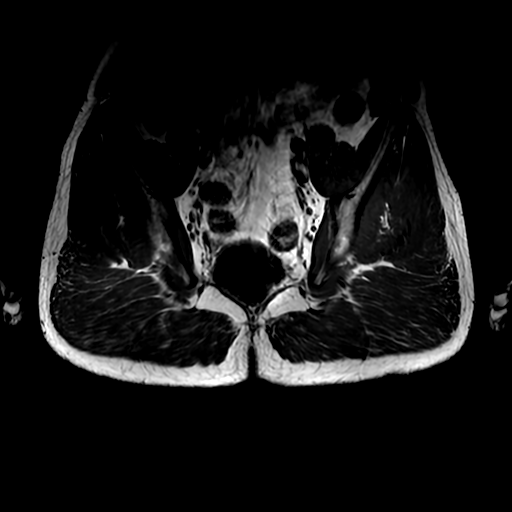
[im 9/29]
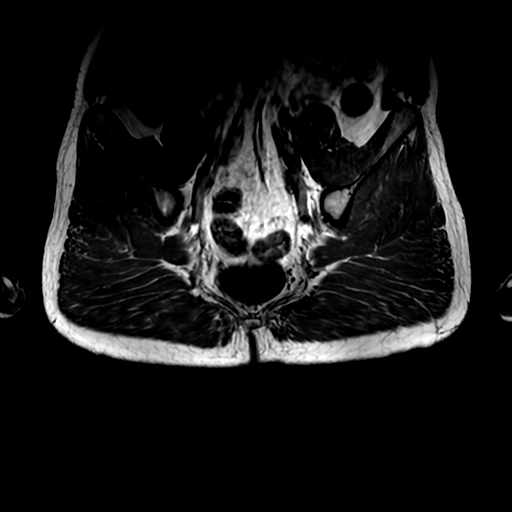
[im 17/29]
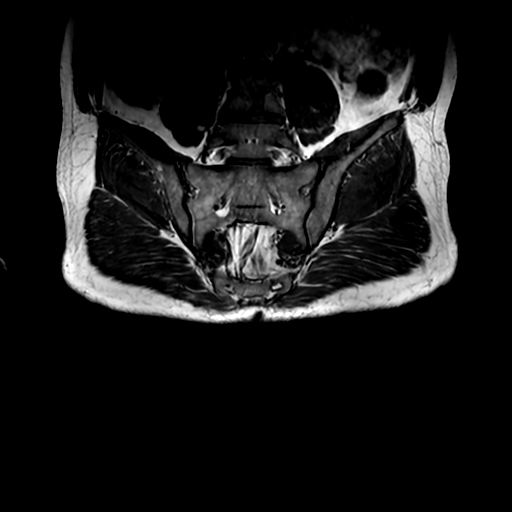
[im 25/29]
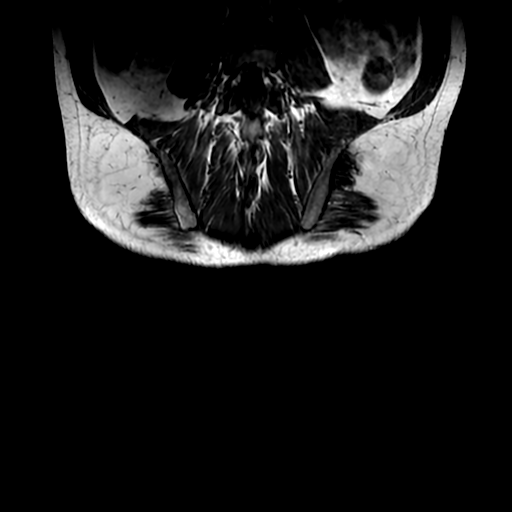

[Series 7: STIR · coronal · 3.0mm · 0.78mm/px · 3 of 29 slices shown]
[im 5/29]
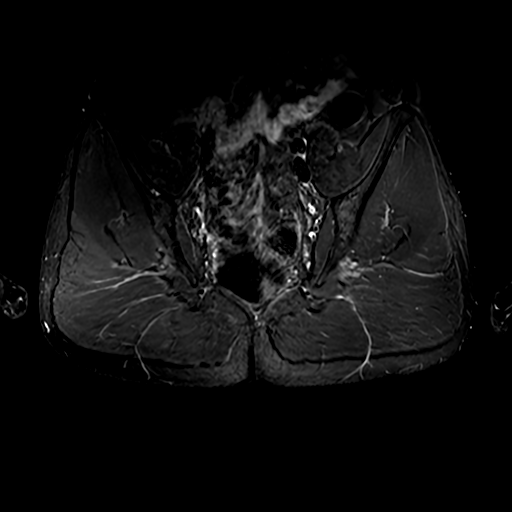
[im 17/29]
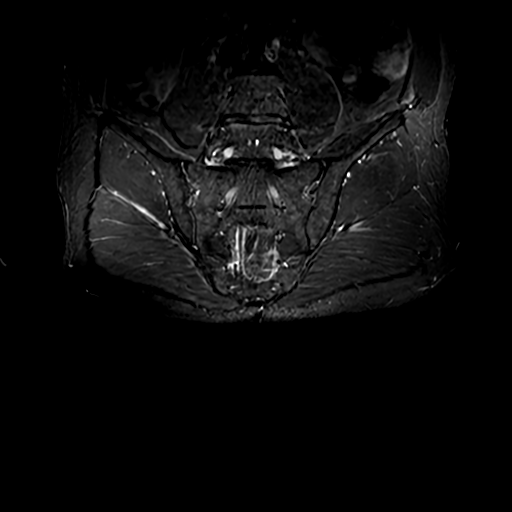
[im 25/29]
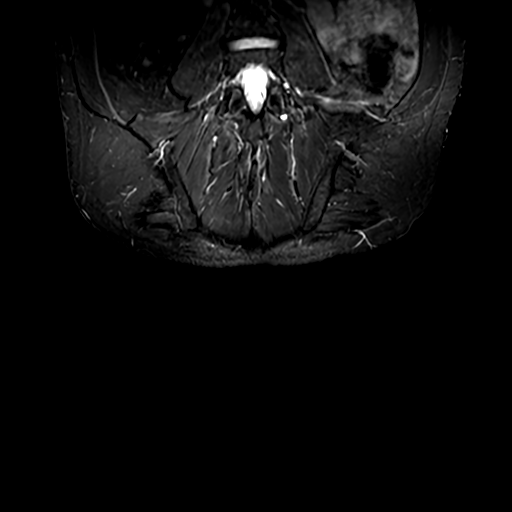

[19 of 48 positions shown; findings below may reference images not displayed]

FINDINGS: Bones/Joint/Cartilage

No fracture or dislocation. Normal alignment. No joint effusion. No
marrow signal abnormality.

No SI joint widening or erosive changes. No subchondral reactive
marrow changes. No SI joint effusion.

Mild desiccation at L4-5. Central annular fissure at L4-5 and L5-S1.
No foraminal stenosis.

Ligaments, Muscles and Tendons

Flexors: Normal.

Extensors: Normal.

Abductors: Normal.

Adductors: Normal.

Rotators: Normal.

Hamstrings: Normal.

Soft tissue
No fluid collection or hematoma. No soft tissue mass. Normal
neurovascular bundles.
IMPRESSION: 1. No acute injury of the pelvis.
2. No sacroiliitis.
3. No focal abnormality of the right groin.
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
## 2024-03-25 ENCOUNTER — Ambulatory Visit (INDEPENDENT_AMBULATORY_CARE_PROVIDER_SITE_OTHER): Admitting: Psychiatry

## 2024-03-25 DIAGNOSIS — F331 Major depressive disorder, recurrent, moderate: Secondary | ICD-10-CM | POA: Diagnosis not present

## 2024-03-25 DIAGNOSIS — F1020 Alcohol dependence, uncomplicated: Secondary | ICD-10-CM

## 2024-03-25 DIAGNOSIS — F411 Generalized anxiety disorder: Secondary | ICD-10-CM

## 2024-03-25 MED ORDER — LAMOTRIGINE 25 MG PO TABS
50.0000 mg | ORAL_TABLET | Freq: Every day | ORAL | 2 refills | Status: DC
Start: 2024-03-25 — End: 2024-05-20

## 2024-03-25 MED ORDER — NALTREXONE HCL 50 MG PO TABS
100.0000 mg | ORAL_TABLET | Freq: Every day | ORAL | 2 refills | Status: DC
Start: 2024-03-25 — End: 2024-05-20

## 2024-03-25 MED ORDER — SERTRALINE HCL 100 MG PO TABS
200.0000 mg | ORAL_TABLET | Freq: Every day | ORAL | 2 refills | Status: DC
Start: 2024-03-25 — End: 2024-05-20

## 2024-04-02 DIAGNOSIS — Z419 Encounter for procedure for purposes other than remedying health state, unspecified: Secondary | ICD-10-CM | POA: Diagnosis not present

## 2024-04-12 DIAGNOSIS — R059 Cough, unspecified: Secondary | ICD-10-CM | POA: Diagnosis not present

## 2024-04-12 DIAGNOSIS — J029 Acute pharyngitis, unspecified: Secondary | ICD-10-CM | POA: Diagnosis not present

## 2024-05-06 ENCOUNTER — Ambulatory Visit (HOSPITAL_COMMUNITY): Admitting: Licensed Clinical Social Worker

## 2024-05-06 DIAGNOSIS — F900 Attention-deficit hyperactivity disorder, predominantly inattentive type: Secondary | ICD-10-CM | POA: Diagnosis not present

## 2024-05-06 DIAGNOSIS — F411 Generalized anxiety disorder: Secondary | ICD-10-CM

## 2024-05-06 DIAGNOSIS — F331 Major depressive disorder, recurrent, moderate: Secondary | ICD-10-CM

## 2024-05-06 NOTE — Progress Notes (Signed)
 THERAPIST PROGRESS NOTE  Virtual Visit via Video Note  I connected with Camellia Agent on 05/06/24 at 11:00 AM EDT by a video enabled telemedicine application and verified that I am speaking with the correct person using two identifiers.  Location: Patient: [facility]  Provider: Providers Home Office    I discussed the limitations of evaluation and management by telemedicine and the availability of in person appointments. The patient expressed understanding and agreed to proceed.   I discussed the assessment and treatment plan with the patient. The patient was provided an opportunity to ask questions and all were answered. The patient agreed with the plan and demonstrated an understanding of the instructions.   The patient was advised to call back or seek an in-person evaluation if the symptoms worsen or if the condition fails to improve as anticipated.  I provided 45 minutes of non-face-to-face time during this encounter.   Juliene GORMAN Patee, LCSW   Participation Level: Active  Behavioral Response: CasualAlertAnxious and Depressed  Type of Therapy: Individual Therapy  Treatment Goals addressed:  Active     OP Depression     LTG: Reduce frequency, intensity, and duration of depression symptoms so that daily functioning is improved (Progressing)     Start:  08/08/23    Expected End:  06/18/24         STG: Camellia will attend at least 80% of scheduled family sessions  (Progressing)     Start:  08/08/23    Expected End:  06/18/24         STG: Camellia will reduce frequency of avoidant behaviors by 50% as evidenced by self-report in therapy sessions (Progressing)     Start:  08/08/23    Expected End:  06/18/24         Work with Camellia to track symptoms, triggers, and/or skill use through a mood chart, diary card, or journal (Completed)     Start:  08/08/23    End:  10/20/23      Encourage Camellia to participate in recovery peer support activities weekly  (Completed)      Start:  08/08/23    End:  10/20/23      Provide Camellia educational information and reading material on dissociation, its causes, and symptoms (Completed)     Start:  08/08/23    End:  10/20/23      Work with Camellia to identify the major components of a recent episode of depression: physical symptoms, major thoughts and images, and major behaviors they experienced (Completed)     Start:  08/08/23    End:  12/01/23          ProgressTowards Goals: Progressing  Interventions: CBT, Motivational Interviewing, and Supportive    Suicidal/Homicidal: Nowithout intent/plan  Therapist Response:   Vaun was alert and oriented x 5.  He was pleasant, cooperative, maintained good eye contact.  Patient engaged well in therapy session and was dressed casually.  He presented today with anxious mood\affect.  Patient states today things have been going well.  He states that he has some depressive episodes where he does not feel like getting out of bed but states that they are decreasing.  Elena would like to discuss in session today how to minimize his depressive episodes and how to not make them worse.  LCSW educated patient on the difference between needs and wants.  LCSW advised patient that moving forward have a backup plan for the necessities of what he needs to deal with for the  day this way he has an alternative plan if he has a depressive episode.     LCSW advised patient of the use of positive affirmations to improve mood and affect.  LCSW and patient spoke today about barriers to speaking about his depression with loved ones and friends.  LCSW educated patient on the use of conditioning to help gradually increase intensity of conversations.  LCSW and patient spoke about today the difference between prioritization and partialization.     LCSW utilized psychoanalytic therapy for patient to express thoughts, feelings and emotions and nonjudgmental environment.  LCSW spoke with patient today about  transferring to [place] office through [facility] health and due to his insurance did have the option to transfer with this LCSW.  Patient was agreeable to transfer to [facility] office as of December 14.  Plan: Return again in 4 weeks.  Diagnosis: Generalized anxiety disorder  Moderate episode of recurrent major depressive disorder (HCC)  Attention deficit hyperactivity disorder (ADHD), predominantly inattentive type  Collaboration of Care: Other Continued individual therapy and medication mgnt at [facility]    Patient/Guardian was advised Release of Information must be obtained prior to any record release in order to collaborate their care with an outside provider. Patient/Guardian was advised if they have not already done so to contact the registration department to sign all necessary forms in order for us  to release information regarding their care.   Consent: Patient/Guardian gives verbal consent for treatment and assignment of benefits for services provided during this visit. Patient/Guardian expressed understanding and agreed to proceed.   Juliene GORMAN Patee, LCSW 05/06/2024
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
## 2024-05-20 ENCOUNTER — Ambulatory Visit (INDEPENDENT_AMBULATORY_CARE_PROVIDER_SITE_OTHER): Admitting: Psychiatry

## 2024-05-20 ENCOUNTER — Telehealth (HOSPITAL_COMMUNITY): Payer: Self-pay

## 2024-05-20 DIAGNOSIS — F411 Generalized anxiety disorder: Secondary | ICD-10-CM | POA: Diagnosis not present

## 2024-05-20 DIAGNOSIS — R5383 Other fatigue: Secondary | ICD-10-CM | POA: Diagnosis not present

## 2024-05-20 DIAGNOSIS — F1020 Alcohol dependence, uncomplicated: Secondary | ICD-10-CM | POA: Diagnosis not present

## 2024-05-20 DIAGNOSIS — F331 Major depressive disorder, recurrent, moderate: Secondary | ICD-10-CM

## 2024-05-20 MED ORDER — SERTRALINE HCL 100 MG PO TABS: 200.0000 mg | ORAL_TABLET | Freq: Every day | ORAL | 2 refills | Status: DC

## 2024-05-20 MED ORDER — LAMOTRIGINE 25 MG PO TABS: 25.0000 mg | ORAL_TABLET | Freq: Every day | ORAL | 2 refills | Status: DC

## 2024-05-20 MED ORDER — NICOTINE 14 MG/24HR TD PT24: 14.0000 mg | MEDICATED_PATCH | Freq: Every day | TRANSDERMAL | 0 refills | Status: AC

## 2024-05-20 MED ORDER — NALTREXONE HCL 50 MG PO TABS: 100.0000 mg | ORAL_TABLET | Freq: Every day | ORAL | 2 refills | Status: DC

## 2024-05-20 MED ORDER — LAMOTRIGINE 25 MG PO TABS: 50.0000 mg | ORAL_TABLET | Freq: Every day | ORAL | 2 refills | Status: DC

## 2024-05-20 NOTE — Telephone Encounter (Signed)
 Dr. Stephanie Chien referred this pt to [facility]. He currently sees Tracy Cong, LCSW.  This therapist in basket messages Tracy Robinson for his opinion.  Tracy Robinson notes he is not sure if he meets criteria. Therapist asks if he minds if this therapist calls him and explores his interest.  Tracy replied he would encourage anyone that needed more intense therapy to seek it.  Therapist calls Tracy Robinson and he answers. Therapist confirms his identify by obtaining two verifiers.  Therapist explains her reason for calling and briefly describes the [facility] program.  Tracy Robinson says he is not denying he needs it, but states he cannot attend during the day due to him  working during the day.  Therapist informs him [facility] has an evening program if that would accommodate his schedule better.  Tracy Robinson says after he sees Tracy Robinson for the last appointment with him before he transitions to [place] office, he will call [facility].  Darice Simpler, MS, LMFT, LCAS

## 2024-05-24 ENCOUNTER — Encounter (HOSPITAL_COMMUNITY): Payer: Self-pay

## 2024-05-24 ENCOUNTER — Ambulatory Visit (HOSPITAL_COMMUNITY): Admitting: Licensed Clinical Social Worker

## 2024-06-02 DIAGNOSIS — Z419 Encounter for procedure for purposes other than remedying health state, unspecified: Secondary | ICD-10-CM | POA: Diagnosis not present
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
## 2024-06-28 ENCOUNTER — Ambulatory Visit (HOSPITAL_COMMUNITY): Admitting: Psychiatry

## 2024-06-30 ENCOUNTER — Institutional Professional Consult (permissible substitution): Admitting: Neurology

## 2024-07-12 ENCOUNTER — Ambulatory Visit (INDEPENDENT_AMBULATORY_CARE_PROVIDER_SITE_OTHER): Admitting: Licensed Clinical Social Worker

## 2024-07-12 DIAGNOSIS — F411 Generalized anxiety disorder: Secondary | ICD-10-CM | POA: Diagnosis not present

## 2024-07-12 DIAGNOSIS — F331 Major depressive disorder, recurrent, moderate: Secondary | ICD-10-CM | POA: Diagnosis not present

## 2024-07-12 NOTE — Progress Notes (Signed)
 "  THERAPIST PROGRESS NOTE  Session Time: 12  Participation Level: Active  Behavioral Response: CasualAlertAnxious  Type of Therapy: Individual Therapy  Treatment Goals addressed:  Active     OP Depression     LTG: Reduce frequency, intensity, and duration of depression symptoms so that daily functioning is improved (Progressing)     Start:  08/08/23    Expected End:  11/18/24         STG: Camellia will attend at least 80% of scheduled family sessions  (Progressing)     Start:  08/08/23    Expected End:  11/18/24         STG: Camellia will reduce frequency of avoidant behaviors by 50% as evidenced by self-report in therapy sessions (Progressing)     Start:  08/08/23    Expected End:  11/18/24         Work with Camellia to track symptoms, triggers, and/or skill use through a mood chart, diary card, or journal (Completed)     Start:  08/08/23    End:  10/20/23      Encourage Camellia to participate in recovery peer support activities weekly  (Completed)     Start:  08/08/23    End:  10/20/23      Provide Camellia educational information and reading material on dissociation, its causes, and symptoms (Completed)     Start:  08/08/23    End:  10/20/23      Work with Camellia to identify the major components of a recent episode of depression: physical symptoms, major thoughts and images, and major behaviors they experienced (Completed)     Start:  08/08/23    End:  12/01/23         ProgressTowards Goals: Progressing  Interventions: CBT, Motivational Interviewing, and Supportive   Suicidal/Homicidal: Nowithout intent/plan  Therapist Response:    S (Subjective): Keifer reports that his primary stressors are related to academic responsibilities. He stated that he has completed his dissertation and submitted it to his advisor for review. Pending advisor approval, he will be eligible to schedule his dissertation defense at a later date. Kazmir reports efforts to focus on personal goals,  including self-care, maintaining his home, and planning for future goals. He stated that he has been prioritizing sleep hygiene by waking and going to bed at consistent times daily and has been intentionally reducing use of the snooze function. He reports that these changes have contributed to a more regular sleep cycle. Lionel also reports a significant reduction in alcohol use, noting consumption of 1-2 drinks total so far in the month of December. Additionally, he reports that since initiating use of a nicotine  patch, he has not used cigarettes or e-cigarettes.  O (Objective): Brysyn was alert and oriented 5. He presented as pleasant and cooperative, maintained good eye contact, and was appropriately dressed in casual attire. He actively engaged in the therapeutic session.  A (Assessment): Terrie demonstrates progress toward academic, behavioral, and health-related goals. He appears motivated and future-oriented, with improved sleep hygiene and decreased substance use. Insight into stress management and goal prioritization is evident. No acute distress noted during session.  P (Plan / Interventions):  Utilized psychodynamic therapy to facilitate expression of thoughts, feelings, and emotions in a nonjudgmental environment.  Provided supportive therapy, including praise and encouragement for progress made.  Provided psychoeducation on medication adherence and importance of consistent use as prescribed.  Educated patient on use of a planner and task partialization to reduce stress related to task  completion.  Plan to continue supporting goal attainment, stress management, and maintenance of healthy routines in future sessions.   Plan: Return again in 4 weeks.  Diagnosis: Moderate episode of recurrent major depressive disorder (HCC)  Generalized anxiety disorder  Collaboration of Care: Other    Patient/Guardian was advised Release of Information must be obtained prior to any record release in  order to collaborate their care with an outside provider. Patient/Guardian was advised if they have not already done so to contact the registration department to sign all necessary forms in order for us  to release information regarding their care.   Consent: Patient/Guardian gives verbal consent for treatment and assignment of benefits for services provided during this visit. Patient/Guardian expressed understanding and agreed to proceed.   Juliene GORMAN Patee, LCSW 07/12/2024  "

## 2024-07-19 ENCOUNTER — Encounter: Payer: Self-pay | Admitting: Neurology

## 2024-07-19 ENCOUNTER — Ambulatory Visit (INDEPENDENT_AMBULATORY_CARE_PROVIDER_SITE_OTHER): Admitting: Neurology

## 2024-07-19 VITALS — BP 136/84 | HR 67 | Ht 69.0 in | Wt 207.2 lb

## 2024-07-19 DIAGNOSIS — R635 Abnormal weight gain: Secondary | ICD-10-CM | POA: Diagnosis not present

## 2024-07-19 DIAGNOSIS — G4719 Other hypersomnia: Secondary | ICD-10-CM | POA: Diagnosis not present

## 2024-07-19 DIAGNOSIS — Z9189 Other specified personal risk factors, not elsewhere classified: Secondary | ICD-10-CM

## 2024-07-19 DIAGNOSIS — E66811 Obesity, class 1: Secondary | ICD-10-CM

## 2024-07-19 DIAGNOSIS — G4763 Sleep related bruxism: Secondary | ICD-10-CM | POA: Diagnosis not present

## 2024-07-19 DIAGNOSIS — R0683 Snoring: Secondary | ICD-10-CM

## 2024-07-19 DIAGNOSIS — R519 Headache, unspecified: Secondary | ICD-10-CM

## 2024-07-19 NOTE — Patient Instructions (Signed)

## 2024-07-19 NOTE — Progress Notes (Signed)
 Subjective:    Patient ID: Tracy Robinson is a 33 y.o. male.  HPI    True Mar, MD, PhD [facility] [address] [address] [place], [place] [address]  Dear Dr. Graham,   I saw your patient, Tracy Robinson, upon your kind request in my sleep clinic today for initial consultation of his sleep disorder, in particular, concern for underlying obstructive sleep apnea.  The patient is unaccompanied today.  As you know, Tracy Robinson is a 33 year old male with an underlying medical history of ADHD, MDD, GAD, alcohol use disorder, smoking with recent cessation, history of marijuana use, and mild obesity, who reports snoring and excessive daytime somnolence, as well as nonrestorative sleep.  His Epworth sleepiness score is 3 out of 24, fatigue severity score is 49 out of 63.   I reviewed your office note from 03/25/2024, as well as 05/20/2024.  He has had difficulty falling asleep for years.  He has some difficulty maintaining sleep.  He is working on lifestyle modification including complete alcohol cessation and smoking cessation.  He is not currently working on marijuana cessation and smokes marijuana about once a week.  He lives alone, he is single, he is working on his dissertation.  He does not have a TV in the bedroom.  He has 1 cat in the household.  He drinks alcohol about 1 drink per week.  He quit smoking about a month and a half ago.  Bedtime is generally between 11 and midnight and rise time between 7 and 8 AM.  He has no nightly nocturia but has had morning headaches.  He has extended family members with sleep apnea.  He sleeps with a retainer and has a history of bruxism as well.  His Past Medical History Is Significant For: Past Medical History:  Diagnosis Date   Alcohol use disorder, moderate, in early remission (HCC) 07/24/2023   Last drink December 2024      His Past Surgical History Is Significant For: History reviewed. No pertinent surgical history.  His  Family History Is Significant For: History reviewed. No pertinent family history.  His Social History Is Significant For: Social History   Socioeconomic History   Marital status: Single    Spouse name: Not on file   Number of children: Not on file   Years of education: Not on file   Highest education level: Not on file  Occupational History   Not on file  Tobacco Use   Smoking status: Former    Types: E-cigarettes    Quit date: 06/05/2024    Years since quitting: 0.1   Smokeless tobacco: Never   Tobacco comments:    Vapes daily  Vaping Use   Vaping status: Former  Substance and Sexual Activity   Alcohol use: Not Currently   Drug use: Not Currently    Types: Marijuana    Comment: occasionally   Sexual activity: Not on file  Other Topics Concern   Not on file  Social History Narrative   Patient does live alone   Patient is currently enrolled full time, working part time.    Social Drivers of Health   Tobacco Use: Medium Risk (07/19/2024)   Patient History    Smoking Tobacco Use: Former    Smokeless Tobacco Use: Never    Passive Exposure: Not on file  Financial Resource Strain: Low Risk (04/28/2023)   Overall Financial Resource Strain (CARDIA)    Difficulty of Paying Living Expenses: Not very hard  Recent Concern: Programmer, Applications - High Risk (04/28/2023)   Overall Financial Resource Strain (CARDIA)    Difficulty of Paying Living Expenses: Hard  Food Insecurity: No Food Insecurity (04/28/2023)   Hunger Vital Sign    Worried About Running Out of Food in the Last Year: Never true    Ran Out of Food in the Last Year: Never true  Transportation Needs: No Transportation Needs (04/28/2023)   PRAPARE - Administrator, Civil Service (Medical): No    Lack of Transportation (Non-Medical): No  Physical Activity: Insufficiently Active (04/28/2023)   Exercise Vital Sign    Days of Exercise per Week: 2 days    Minutes of Exercise per Session: 60 min  Stress:  Stress Concern Present (04/28/2023)   [employer] of Occupational Health - Occupational Stress Questionnaire    Feeling of Stress : Very much  Social Connections: Moderately Integrated (04/28/2023)   Social Connection and Isolation Panel    Frequency of Communication with Friends and Family: Three times a week    Frequency of Social Gatherings with Friends and Family: Twice a week    Attends Religious Services: More than 4 times per year    Active Member of Clubs or Organizations: Yes    Attends Banker Meetings: More than 4 times per year    Marital Status: Never married  Depression (PHQ2-9): High Risk (04/29/2023)   Depression (PHQ2-9)    PHQ-2 Score: 14  Alcohol Screen: Medium Risk (04/28/2023)   Alcohol Screen    Last Alcohol Screening Score (AUDIT): 14  Housing: Low Risk (04/28/2023)   Housing    Last Housing Risk Score: 0  Utilities: Not At Risk (04/28/2023)   AHC Utilities    Threatened with loss of utilities: No  Health Literacy: Adequate Health Literacy (04/28/2023)   B1300 Health Literacy    Frequency of need for help with medical instructions: Never    His Allergies Are:  Allergies[1]:   His Current Medications Are:  Outpatient Encounter Medications as of 07/19/2024  Medication Sig   cetirizine (ZYRTEC ALLERGY) 10 MG tablet Take 10 mg by mouth daily.   lamoTRIgine  (LAMICTAL ) 25 MG tablet Take 2 tablets (50 mg total) by mouth daily.   naltrexone  (DEPADE) 50 MG tablet Take 2 tablets (100 mg total) by mouth daily.   sertraline  (ZOLOFT ) 100 MG tablet Take 2 tablets (200 mg total) by mouth daily.   amphetamine -dextroamphetamine (ADDERALL XR) 20 MG 24 hr capsule Take 1 capsule (20 mg total) by mouth daily. (Patient not taking: Reported on 07/19/2024)   lamoTRIgine  (LAMICTAL ) 25 MG tablet Take 1 tablet (25 mg total) by mouth daily. (Patient not taking: Reported on 07/19/2024)   nicotine  (NICODERM CQ  - DOSED IN MG/24 HOURS) 14 mg/24hr patch Place 1 patch (14 mg  total) onto the skin daily. (Patient not taking: Reported on 07/19/2024)   No facility-administered encounter medications on file as of 07/19/2024.  :   Review of Systems:  Out of a complete 14 point review of systems, all are reviewed and negative with the exception of these symptoms as listed below:   Review of Systems  Objective:  Neurological Exam  Physical Exam Physical Examination:   Vitals:   07/19/24 1259  BP: 136/84  Pulse: 67    General Examination: The patient is a very pleasant 33 y.o. male in no acute distress. He appears well-developed and well-nourished and well groomed.   HEENT: Normocephalic, atraumatic, pupils are equal, round and reactive to  light, extraocular tracking is good without limitation to gaze excursion or nystagmus noted. No photophobia.  Corrective eye glasses in place. Hearing is grossly intact.  Face is symmetric with normal facial animation. Speech is quite nasally congested sounding, no dysarthria. There is no hypophonia. There is no lip, neck/head, jaw or voice tremor. Neck is supple with full range of passive and active motion. There are no carotid bruits on auscultation.  Airway/Oropharynx exam reveals: mild mouth dryness, adequate dental hygiene and mild airway crowding, due to small airway entry and tonsillar size of about 1-2+ bilaterally.  He has a thicker tongue.  Mallampati class II.  Neck circumference 16 1/4 inches, minimal overbite noted. Tongue protrudes centrally and palate elevates symmetrically.    Chest: Clear to auscultation without wheezing, rhonchi or crackles noted.  Heart: S1+S2+0, regular and normal without murmurs, rubs or gallops noted.   Abdomen: Soft, non-tender and non-distended.  Extremities: There is no pitting edema in the distal lower extremities bilaterally.   Skin: Warm and dry without trophic changes noted.   Musculoskeletal: exam reveals no obvious joint deformities.   Neurologically:  Mental status: The  patient is awake, alert and oriented in all 4 spheres. His immediate and remote memory, attention, language skills and fund of knowledge are appropriate. There is no evidence of aphasia, agnosia, apraxia or anomia. Speech is clear with normal prosody and enunciation. Thought process is linear. Mood is normal and affect is normal.  Cranial nerves II - XII are as described above under HEENT exam.  Motor exam: Normal bulk, moving all 4 extremities without obvious restriction, no obvious action or resting tremor.  Fine motor skills and coordination: Intact grossly.  Cerebellar testing: No dysmetria or intention tremor. There is no truncal or gait ataxia.  Sensory exam: intact to light touch in the upper and lower extremities.  Gait, station and balance: He stands easily. No veering to one side is noted. No leaning to one side is noted. Posture is age-appropriate and stance is narrow based. Gait shows normal stride length and normal pace. No problems turning are noted.   Assessment and Plan:   In summary, Tracy Robinson is a 33 year old male with an underlying medical history of ADHD, MDD, GAD, alcohol use disorder, smoking with recent cessation, history of marijuana use, and mild obesity, whose history and physical exam are concerning for sleep disordered breathing, particularly obstructive sleep apnea (OSA). A laboratory attended sleep study is typically considered gold standard for evaluation of sleep disordered breathing.   I had a long chat with the patient about my findings and the diagnosis of sleep apnea, particularly OSA, its prognosis and treatment options. We talked about medical/conservative treatments, surgical interventions and non-pharmacological approaches for symptom control. I explained, in particular, the risks and ramifications of untreated moderate to severe OSA, especially with respect to developing cardiovascular disease down the road, including congestive heart failure (CHF), difficult to  treat hypertension, cardiac arrhythmias (particularly A-fib), neurovascular complications including TIA, stroke and dementia. Even type 2 diabetes has, in part, been linked to untreated OSA. Symptoms of untreated OSA may include (but may not be limited to) daytime sleepiness, nocturia (i.e. frequent nighttime urination), memory problems, mood irritability and suboptimally controlled or worsening mood disorder such as depression and/or anxiety, lack of energy, lack of motivation, physical discomfort, as well as recurrent headaches, especially morning or nocturnal headaches. We talked about the importance of maintaining a healthy lifestyle and striving for healthy weight.  The importance of complete nicotine  and  marijuana smoking cessation was also addressed.  In addition, we talked about the importance of striving for and maintaining good sleep hygiene. I recommended a sleep study at this time. I outlined the differences between a laboratory attended sleep study which is considered more comprehensive and accurate over the option of a home sleep test (HST); the latter may lead to underestimation of sleep disordered breathing in some instances and does not help with diagnosing upper airway resistance syndrome and is not accurate enough to diagnose primary central sleep apnea typically. I outlined possible surgical and non-surgical treatment options of OSA, including the use of a positive airway pressure (PAP) device (i.e. CPAP, AutoPAP/APAP or BiPAP in certain circumstances), a custom-made dental device (aka oral appliance, which would require a referral to a specialist dentist or orthodontist typically, and is generally speaking not considered for patients with full dentures or edentulous state), upper airway surgical options, such as traditional UPPP (which is not considered a first-line treatment) or the Inspire device (hypoglossal nerve stimulator, which would involve a referral for consultation with an ENT  surgeon, after careful selection, following inclusion criteria - also not first-line treatment). I explained the PAP treatment option to the patient in detail, as this is generally considered first-line treatment.  The patient indicated that he would be willing to try PAP therapy, if the need arises. I explained the importance of being compliant with PAP treatment, not only for insurance purposes but primarily to improve patient's symptoms symptoms, and for the patient's long term health benefit, including to reduce His cardiovascular risks longer-term.    We will pick up our discussion about the next steps and treatment options after testing.  We will keep him posted as to the test results by phone call and/or MyChart messaging where possible.  We will plan to follow-up in sleep clinic accordingly as well.  I answered all his questions today and the patient was in agreement.   I encouraged him to call with any interim questions, concerns, problems or updates or email us  through MyChart.  Generally speaking, sleep test authorizations may take up to 2 weeks, sometimes less, sometimes longer, the patient is encouraged to get in touch with us  if they do not hear back from the sleep lab staff directly within the next 2 weeks.  Thank you very much for allowing me to participate in the care of this nice patient. If I can be of any further assistance to you please do not hesitate to call me at [phone].  Sincerely,   True Mar, MD, PhD     [1] No Known Allergies
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
## 2024-08-02 ENCOUNTER — Ambulatory Visit (HOSPITAL_BASED_OUTPATIENT_CLINIC_OR_DEPARTMENT_OTHER): Admitting: Psychiatry

## 2024-08-02 DIAGNOSIS — F17200 Nicotine dependence, unspecified, uncomplicated: Secondary | ICD-10-CM

## 2024-08-02 DIAGNOSIS — F17201 Nicotine dependence, unspecified, in remission: Secondary | ICD-10-CM

## 2024-08-02 DIAGNOSIS — R5383 Other fatigue: Secondary | ICD-10-CM

## 2024-08-02 DIAGNOSIS — F1020 Alcohol dependence, uncomplicated: Secondary | ICD-10-CM

## 2024-08-02 DIAGNOSIS — F411 Generalized anxiety disorder: Secondary | ICD-10-CM | POA: Diagnosis not present

## 2024-08-02 DIAGNOSIS — F331 Major depressive disorder, recurrent, moderate: Secondary | ICD-10-CM

## 2024-08-02 DIAGNOSIS — Z8659 Personal history of other mental and behavioral disorders: Secondary | ICD-10-CM | POA: Diagnosis not present

## 2024-08-02 MED ORDER — LAMOTRIGINE 25 MG PO TABS: 50.0000 mg | ORAL_TABLET | Freq: Every day | ORAL | 0 refills | Status: AC

## 2024-08-02 MED ORDER — NALTREXONE HCL 50 MG PO TABS: 100.0000 mg | ORAL_TABLET | Freq: Every day | ORAL | 0 refills | Status: AC

## 2024-08-02 MED ORDER — SERTRALINE HCL 100 MG PO TABS: 200.0000 mg | ORAL_TABLET | Freq: Every day | ORAL | 0 refills | Status: AC

## 2024-08-03 ENCOUNTER — Telehealth: Payer: Self-pay | Admitting: Neurology

## 2024-08-03 ENCOUNTER — Ambulatory Visit (HOSPITAL_COMMUNITY): Admitting: Licensed Clinical Social Worker

## 2024-08-03 NOTE — Addendum Note (Signed)
 Addended by: CARVIN CROCK on: 08/03/2024 08:20 AM   Modules accepted: Level of Service

## 2024-08-03 NOTE — Telephone Encounter (Signed)
 [employer] M[employer] well[employer] pending

## 2024-08-17 NOTE — Telephone Encounter (Signed)
 [employer] M[employer] well[employer] shara: [id number] (exp. 08/03/24 to 11/07/24)

## 2024-10-11 ENCOUNTER — Ambulatory Visit (HOSPITAL_COMMUNITY): Admitting: Psychiatry
